# Patient Record
Sex: Male | Born: 1980
Health system: Southern US, Community
[De-identification: ages and names within clinical notes are randomized; demographics above are authoritative.]

## PROBLEM LIST (undated history)

## (undated) DIAGNOSIS — I10 Essential (primary) hypertension: Secondary | ICD-10-CM

## (undated) DIAGNOSIS — E7800 Pure hypercholesterolemia, unspecified: Secondary | ICD-10-CM

## (undated) HISTORY — PX: VASECTOMY: SHX75

## (undated) HISTORY — DX: Essential (primary) hypertension: I10

---

## 2005-09-22 DIAGNOSIS — M549 Dorsalgia, unspecified: Secondary | ICD-10-CM

## 2005-09-22 HISTORY — DX: Dorsalgia, unspecified: M54.9

## 2006-09-22 HISTORY — PX: CYST EXCISION: SHX5701

## 2012-08-20 ENCOUNTER — Emergency Department: Payer: Self-pay | Admitting: Emergency Medicine

## 2013-08-31 ENCOUNTER — Emergency Department: Payer: Self-pay

## 2013-09-03 LAB — BETA STREP CULTURE(ARMC)

## 2014-04-12 ENCOUNTER — Encounter: Payer: Self-pay | Admitting: General Surgery

## 2014-05-03 ENCOUNTER — Ambulatory Visit (INDEPENDENT_AMBULATORY_CARE_PROVIDER_SITE_OTHER): Payer: BC Managed Care – PPO | Admitting: General Surgery

## 2014-05-03 ENCOUNTER — Encounter: Payer: Self-pay | Admitting: General Surgery

## 2014-05-03 VITALS — BP 118/80 | HR 70 | Resp 12 | Ht 70.0 in | Wt 207.0 lb

## 2014-05-03 DIAGNOSIS — Z3009 Encounter for other general counseling and advice on contraception: Secondary | ICD-10-CM

## 2014-05-03 NOTE — Progress Notes (Signed)
Patient ID: Michael Stein, male   DOB: 07/02/1981, 33 y.o.   MRN: [id number]  Chief Complaint  Patient presents with  . Other    Vasectomy consult    HPI Michael Stein is a 33 y.o. male here today for a vasectomy consult with his wife. They have 4 children, the youngest boy is 682 weeks old the oldest daughter is 2614.  HPI  No past medical history on file.  Past Surgical History  Procedure Laterality Date  . Cyst excision  2008    ingrown hair    No family history on file.  Social History History  Substance Use Topics  . Smoking status: Never Smoker   . Smokeless tobacco: Never Used  . Alcohol Use: Yes    No Known Allergies  No current outpatient prescriptions on file.   No current facility-administered medications for this visit.    Review of Systems Review of Systems  Constitutional: Negative.   Respiratory: Negative.   Cardiovascular: Negative.     Blood pressure 118/80, pulse 70, resp. rate 12, height 5\' 10"  (1.778 m), weight 207 lb (93.895 kg).  Physical Exam Physical Exam  Constitutional: He is oriented to person, place, and time. He appears well-developed and well-nourished.  Eyes: Conjunctivae are normal.  Neck: Neck supple.  Cardiovascular: Normal rate, regular rhythm and normal heart sounds.   Pulmonary/Chest: Effort normal and breath sounds normal.  Genitourinary:  Vas present bilaterally. Normal testis.  Lymphadenopathy:    He has no cervical adenopathy.  Neurological: He is alert and oriented to person, place, and time.  Skin: Skin is warm and dry.     Assessment    Patient and wife desire elective sterilization.     Plan    Pros and cons of sterilization reviewed.  Patient reluctant to have procedure under local anesthesia. No medical contraindication for sedation.  [facility]    Ref. Dr Kipp BroodStrickland    Michael Stein, Merrily PewJeffrey W 05/04/2014, 3:13 PM

## 2014-05-03 NOTE — Patient Instructions (Addendum)
Vasectomy A vasectomy is tying (with or without cutting) the tube that collects the sperm from the testicle (vas deferens). The vasectomy blocks the sperm from going through the vas deferens and penis so that during sexual intercourse, the sperm does not go into the vagina. Vasectomy is safe, with very rare complications. It does not affect your sexual desire or performance. A vasectomy does not prevent sexually transmitted diseases. Because vasectomy is considered permanent, you should not have it done until you are sure you do not want any more children. You and your partner should be in full agreement to have the procedure. Your decision to have a vasectomy should not be made during a stressful situation. This includes loss of a pregnancy, illness, death of a spouse, or divorce. There are other means of contraception that can be used until you are completely sure you want this procedure done.  LET [facility] CARE PROVIDER KNOW ABOUT:   Any allergies you have.  All medicines you are taking, including vitamins, herbs, eye drops, creams, and over-the-counter medicines.  Previous problems you or members of your family have had with the use of anesthetics.  Any blood disorders you have.  Previous surgeries you have had.  Medical conditions you have. RISKS AND COMPLICATIONS Generally, vasectomy is a safe procedure. However, as with any procedure, complications can occur. Possible complications include:  Failure of the procedure to cause infertility. This means you would still be able to get a male pregnant. Even after sterilization has been achieved, there is a 1 in 10,000 chance that the two cut ends may reconnect (recanalization).  Infection. A germ starts growing in the wound. This can usually be treated with antibiotic medicine(s).  An allergic reaction to the anesthetic or other medicine given.  Bleeding. Blood may seep under the skin so that the scrotum and penis appear to be bruised.  Sometimes the scrotum can swell and get the size of a grapefruit. This usually disappears without treatment within a week or two. BEFORE THE PROCEDURE  Do not take aspirin or aspirin-containing products for 7 days prior to your procedure.  Do not take nonsteroidal anti-inflammatory products for 7 days prior to your procedure.  You may be instructed to wash with soap before coming in for your procedure. PROCEDURE  The scrotum is cleaned with bacteria-killing soap, and the health care provider finds the vas deferens.  Each side of the scrotum is numbed.  A very small cut (incision) is made, and the vas deferens are pulled out of the scrotum. The vas deferens are then tied off, cut, or may be burned (cauterized) at the ends.  Sometimes the vas deferens are pulled out from the scrotum through a puncture wound. This is done with a special instrument without an incision.  The vas deferens are then put back into the scrotum, and the incision or puncture wound is closed. Absorbable suture material that will dissolve and not need to be removed is commonly used.  After surgery, sperm may still be left in the vas deferens for 1-3 months. Because of this, other means of contraception should be used until your health care provider examines you and finds there are no sperm in your seminal fluid. AFTER THE PROCEDURE After the procedure, you will be taken to the recovery area. Your progress will be watched and checked. Once you are awake, stable, and taking fluids well, you will be allowed to go home as long as there are no problems.  Document Released: 11/29/2002  Document Revised: 09/13/2013 Document Reviewed: 03/28/2013 [facility] Patient Information 2015 [place]. This information is not intended to replace advice given to you by your health care provider. Make sure you discuss any questions you have with your health care provider.  [facility].

## 2014-05-04 ENCOUNTER — Other Ambulatory Visit: Payer: Self-pay | Admitting: General Surgery

## 2014-05-04 DIAGNOSIS — Z3009 Encounter for other general counseling and advice on contraception: Secondary | ICD-10-CM | POA: Insufficient documentation

## 2014-05-10 ENCOUNTER — Telehealth: Payer: Self-pay | Admitting: *Deleted

## 2014-05-10 NOTE — Telephone Encounter (Signed)
Patient called the office to reschedule surgery from 05-12-14 to 05-26-14.   Leah in the O.R. has been notified of date change.

## 2014-05-26 ENCOUNTER — Ambulatory Visit: Payer: Self-pay | Admitting: General Surgery

## 2014-05-26 DIAGNOSIS — Z302 Encounter for sterilization: Secondary | ICD-10-CM

## 2014-05-30 ENCOUNTER — Encounter: Payer: Self-pay | Admitting: General Surgery

## 2014-05-31 ENCOUNTER — Encounter: Payer: Self-pay | Admitting: General Surgery

## 2014-05-31 ENCOUNTER — Encounter: Payer: BC Managed Care – PPO | Admitting: General Surgery

## 2014-05-31 NOTE — Progress Notes (Signed)
This encounter was created in error - please disregard.

## 2014-05-31 NOTE — Progress Notes (Deleted)
Patient ID: Michael Stein, male   DOB: Apr 06, 1981, 33 y.o.   MRN: [id number]  Chief Complaint  Patient presents with  . Routine Post Op    Vasectomy     HPI Michael Stein is a 33 y.o. male  HPI  No past medical history on file.  Past Surgical History  Procedure Laterality Date  . Cyst excision  2008    ingrown hair  . Vasectomy  05/26/14.    No family history on file.  Social History History  Substance Use Topics  . Smoking status: Never Smoker   . Smokeless tobacco: Never Used  . Alcohol Use: Yes    No Known Allergies  No current outpatient prescriptions on file.   No current facility-administered medications for this visit.    Review of Systems Review of Systems  Blood pressure 130/90, pulse 64, resp. rate 12, height  (1.778 m), weight 205 lb (92.987 kg).  Physical Exam Physical Exam  Data Reviewed ***  Assessment    ***    Plan    ***       Ples Specter 05/31/2014, 3:27 PM

## 2014-06-01 ENCOUNTER — Encounter: Payer: Self-pay | Admitting: General Surgery

## 2014-06-01 LAB — PATHOLOGY REPORT

## 2014-07-27 ENCOUNTER — Telehealth: Payer: Self-pay | Admitting: *Deleted

## 2014-07-27 NOTE — Telephone Encounter (Signed)
Patient states he forgot and that he would see about bringing a specimen in to send to lab.

## 2014-07-27 NOTE — Telephone Encounter (Signed)
-----   Message from Michael MayotteJeffrey W Byrnett, MD sent at 07/26/2014  5:08 PM EST ----- Patient never had post vasectomy semem samples. Needs to be done to confirm sterility.

## 2014-09-05 ENCOUNTER — Encounter: Payer: Self-pay | Admitting: *Deleted

## 2015-01-13 NOTE — Op Note (Signed)
PATIENT NAME:  Michael NicolasDAVILA, [name] MR#:  [id number] DATE OF BIRTH:  Oct 20, 1980  DATE OF PROCEDURE:  05/26/2014  PREOPERATIVE DIAGNOSIS: Desires permanent sterilization.   POSTOPERATIVE DIAGNOSIS:  Desires permanent sterilization.  OPERATIVE PROCEDURE: Bilateral vasectomy.   SURGEON: Donnalee CurryJeffrey Daira Hine, MD   ANESTHESIA: Monitored anesthesia care, Marcaine 0.25% with Xylocaine 0.5% plain 19 mL local infiltration.   ESTIMATED BLOOD LOSS: Minimal.   CLINICAL NOTE: This 34 year old male and his wife desire permanent sterilization.  He declined office procedure under local anesthesia alone.   OPERATIVE NOTE:  With the patient under adequate sedation, the penis and scrotum were prepped with Betadine scrub and solution and draped.  The above-mentioned local anesthetic was instilled.  A transverse skin line incision was made in the median raphe and the left vas delivered.  The vas was cleared, a 1 cm segment excised.  The lumen obliterated with electrocautery, each end tied with 3-0 Vicryl and then the lumen separated by the external spermatic fascia.   The right vas was treated in a similar fashion.  The subcutaneous tissue was approximated with 3-0 Vicryl subcuticular suture and a dry dressing with a scrotal support was placed.    The patient tolerated the procedure well and was taken to the recovery room in stable condition.      ____________________________ Earline MayotteJeffrey W. Oswin Johal, MD jwb:DT D: 05/26/2014 17:07:22 ET T: 05/26/2014 17:39:23 ET JOB#: [id number]  cc: Earline MayotteJeffrey W. Roscoe Witts, MD, <Dictator> Channah Godeaux Brion AlimentW Sheyann Sulton MD ELECTRONICALLY SIGNED 05/26/2014 19:02

## 2015-02-25 ENCOUNTER — Emergency Department
Admission: EM | Admit: 2015-02-25 | Discharge: 2015-02-26 | Disposition: A | Discharge status: Home / Self Care | Payer: BLUE CROSS/BLUE SHIELD | Attending: Emergency Medicine | Admitting: Emergency Medicine

## 2015-02-25 ENCOUNTER — Encounter: Payer: Self-pay | Admitting: Emergency Medicine

## 2015-02-25 ENCOUNTER — Emergency Department: Payer: BLUE CROSS/BLUE SHIELD

## 2015-02-25 DIAGNOSIS — Z79899 Other long term (current) drug therapy: Secondary | ICD-10-CM | POA: Diagnosis not present

## 2015-02-25 DIAGNOSIS — R161 Splenomegaly, not elsewhere classified: Secondary | ICD-10-CM | POA: Insufficient documentation

## 2015-02-25 DIAGNOSIS — R109 Unspecified abdominal pain: Secondary | ICD-10-CM | POA: Diagnosis present

## 2015-02-25 DIAGNOSIS — R1032 Left lower quadrant pain: Secondary | ICD-10-CM

## 2015-02-25 HISTORY — DX: Pure hypercholesterolemia, unspecified: E78.00

## 2015-02-25 LAB — CBC WITH DIFFERENTIAL/PLATELET
Basophils Absolute: 0 10*3/uL (ref 0–0.1)
Basophils Relative: 1 %
Eosinophils Absolute: 0.2 10*3/uL (ref 0–0.7)
Eosinophils Relative: 4 %
HCT: 40.9 % (ref 40.0–52.0)
Hemoglobin: 13.9 g/dL (ref 13.0–18.0)
LYMPHS ABS: 1.7 10*3/uL (ref 1.0–3.6)
Lymphocytes Relative: 38 %
MCH: 28.7 pg (ref 26.0–34.0)
MCHC: 34 g/dL (ref 32.0–36.0)
MCV: 84.5 fL (ref 80.0–100.0)
MONO ABS: 0.4 10*3/uL (ref 0.2–1.0)
Monocytes Relative: 8 %
Neutro Abs: 2.3 10*3/uL (ref 1.4–6.5)
Neutrophils Relative %: 49 %
PLATELETS: 209 10*3/uL (ref 150–440)
RBC: 4.85 MIL/uL (ref 4.40–5.90)
RDW: 13.1 % (ref 11.5–14.5)
WBC: 4.6 10*3/uL (ref 3.8–10.6)

## 2015-02-25 LAB — URINALYSIS COMPLETE WITH MICROSCOPIC (ARMC ONLY)
BILIRUBIN URINE: NEGATIVE
Bacteria, UA: NONE SEEN
GLUCOSE, UA: NEGATIVE mg/dL
HGB URINE DIPSTICK: NEGATIVE
Ketones, ur: NEGATIVE mg/dL
LEUKOCYTES UA: NEGATIVE
NITRITE: NEGATIVE
PH: 5 (ref 5.0–8.0)
Protein, ur: NEGATIVE mg/dL
SPECIFIC GRAVITY, URINE: 1.031 — AB (ref 1.005–1.030)
SQUAMOUS EPITHELIAL / LPF: NONE SEEN

## 2015-02-25 MED ORDER — ONDANSETRON 8 MG PO TBDP
8.0000 mg | ORAL_TABLET | Freq: Once | ORAL | Status: AC
  Administered 2015-02-25: 8 mg via ORAL

## 2015-02-25 MED ORDER — OXYCODONE-ACETAMINOPHEN 5-325 MG PO TABS
1.0000 | ORAL_TABLET | Freq: Once | ORAL | Status: AC
  Administered 2015-02-25: 1 via ORAL

## 2015-02-25 MED ORDER — ONDANSETRON 8 MG PO TBDP
ORAL_TABLET | ORAL | Status: AC
  Administered 2015-02-25: 8 mg via ORAL
  Filled 2015-02-25: qty 1

## 2015-02-25 MED ORDER — OXYCODONE-ACETAMINOPHEN 5-325 MG PO TABS
ORAL_TABLET | ORAL | Status: AC
  Administered 2015-02-25: 1 via ORAL
  Filled 2015-02-25: qty 1

## 2015-02-25 NOTE — ED Notes (Signed)
Pt states left lower quad pain since Friday, worse over the last few days, denies n/v/d. States pain increases with palpation. Pt denies urinary s/s.

## 2015-02-26 LAB — COMPREHENSIVE METABOLIC PANEL
ALBUMIN: 4.8 g/dL (ref 3.5–5.0)
ALT: 43 U/L (ref 17–63)
AST: 31 U/L (ref 15–41)
Alkaline Phosphatase: 77 U/L (ref 38–126)
Anion gap: 7 (ref 5–15)
BUN: 14 mg/dL (ref 6–20)
CHLORIDE: 105 mmol/L (ref 101–111)
CO2: 25 mmol/L (ref 22–32)
CREATININE: 0.81 mg/dL (ref 0.61–1.24)
Calcium: 9.1 mg/dL (ref 8.9–10.3)
GFR calc Af Amer: >60 mL/min (ref >60)
GFR calc non Af Amer: >60 mL/min (ref >60)
Glucose, Bld: 93 mg/dL (ref 65–99)
Potassium: 3.5 mmol/L (ref 3.5–5.1)
Sodium: 137 mmol/L (ref 135–145)
Total Bilirubin: 0.6 mg/dL (ref 0.3–1.2)
Total Protein: 8 g/dL (ref 6.5–8.1)

## 2015-02-26 LAB — LIPASE, BLOOD: LIPASE: 48 U/L (ref 22–51)

## 2015-02-26 MED ORDER — ONDANSETRON HCL 4 MG PO TABS: 4.0000 mg | ORAL_TABLET | Freq: Four times a day (QID) | ORAL | Status: DC | PRN

## 2015-02-26 MED ORDER — DICYCLOMINE HCL 20 MG PO TABS: 20.0000 mg | ORAL_TABLET | Freq: Three times a day (TID) | ORAL | Status: DC | PRN

## 2015-02-26 MED ORDER — NAPROXEN 250 MG PO TABS: 250.0000 mg | ORAL_TABLET | Freq: Two times a day (BID) | ORAL | Status: DC

## 2015-02-26 NOTE — ED Provider Notes (Signed)
[facility] Emergency Department Provider Note  ____________________________________________  Time seen: 11:05 PM  I have reviewed the triage vital signs and the nursing notes.   HISTORY  Chief Complaint Abdominal Pain    HPI Michael Stein is a 34 y.o. male who reports abdominal pain for 2 days. It initially started periumbilically and then migrated to the left lower quadrant. No nausea vomiting or diarrhea, he is eating and drinking normally, no chest pain shortness of breath cough fever chills runny nose or other symptoms. No dysuria frequency urgency or hematuria.     Past Medical History  Diagnosis Date  . High cholesterol     Patient Active Problem List   Diagnosis Date Noted  . Vasectomy evaluation 05/04/2014    Past Surgical History  Procedure Laterality Date  . Cyst excision  2008    ingrown hair  . Vasectomy  05/26/14.    Current Outpatient Rx  Name  Route  Sig  Dispense  Refill  . atorvastatin (LIPITOR) 10 MG tablet   Oral   Take 10 mg by mouth daily.         Marland Kitchen dicyclomine (BENTYL) 20 MG tablet   Oral   Take 1 tablet (20 mg total) by mouth 3 (three) times daily as needed for spasms.   30 tablet   0   . naproxen (NAPROSYN) 250 MG tablet   Oral   Take 1 tablet (250 mg total) by mouth 2 (two) times daily with a meal.   40 tablet   0   . ondansetron (ZOFRAN) 4 MG tablet   Oral   Take 1 tablet (4 mg total) by mouth every 6 (six) hours as needed for nausea or vomiting.   20 tablet   1     Allergies Review of patient's allergies indicates no known allergies.  History reviewed. No pertinent family history.  Social History History  Substance Use Topics  . Smoking status: Never Smoker   . Smokeless tobacco: Never Used  . Alcohol Use: Yes    Review of Systems  Constitutional: No fever or chills. No weight changes Eyes:No blurry vision or double vision.  ENT: No sore throat. Cardiovascular: No chest  pain. Respiratory: No dyspnea or cough. Gastrointestinal: Abdominal pain as above, no, vomiting and diarrhea.  No BRBPR or melena. Genitourinary: Negative for dysuria, urinary retention, bloody urine, or difficulty urinating. Musculoskeletal: Negative for back pain. No joint swelling or pain. Skin: Negative for rash. Neurological: Negative for headaches, focal weakness or numbness. Psychiatric:No anxiety or depression.   Endocrine:No hot/cold intolerance, changes in energy, or sleep difficulty.  10-point ROS otherwise negative.  ____________________________________________   PHYSICAL EXAM:  VITAL SIGNS: ED Triage Vitals  Enc Vitals Group     BP 02/25/15 2316 160/105 mmHg     Pulse Rate 02/25/15 2316 61     Resp 02/25/15 2316 18     Temp 02/25/15 2316 98.1 F (36.7 C)     Temp Source 02/25/15 2316 Oral     SpO2 02/25/15 2316 97 %     Weight 02/25/15 2305 205 lb (92.987 kg)     Height 02/25/15 2305  (1.753 m)     Head Cir --      Peak Flow --      Pain Score 02/25/15 2305 8     Pain Loc --      Pain Edu? --      Excl. in GC? --      Constitutional:  Alert and oriented. Well appearing and in no distress. Eyes: No scleral icterus. No conjunctival pallor. PERRL. EOMI ENT   Head: Normocephalic and atraumatic.   Nose: No congestion/rhinnorhea. No septal hematoma   Mouth/Throat: MMM, no pharyngeal erythema. No peritonsillar mass. No uvula shift.   Neck: No stridor. No SubQ emphysema. No meningismus. Hematological/Lymphatic/Immunilogical: No cervical lymphadenopathy. Cardiovascular: RRR. Normal and symmetric distal pulses are present in all extremities. No murmurs, rubs, or gallops. Respiratory: Normal respiratory effort without tachypnea nor retractions. Breath sounds are clear and equal bilaterally. No wheezes/rales/rhonchi. Gastrointestinal: Right lower quadrant and left lower quadrant abdominal pain.. No distention. There is no CVA tenderness.  No rebound,  rigidity, or guarding. Genitourinary: deferred Musculoskeletal: Nontender with normal range of motion in all extremities. No joint effusions.  No lower extremity tenderness.  No edema. Neurologic:   Normal speech and language.  CN 2-10 normal. Motor grossly intact. No pronator drift.  Normal gait. No gross focal neurologic deficits are appreciated.  Skin:  Skin is warm, dry and intact. No rash noted.  No petechiae, purpura, or bullae. Psychiatric: Mood and affect are normal. Speech and behavior are normal. Patient exhibits appropriate insight and judgment.  ____________________________________________    LABS (pertinent positives/negatives) (all labs ordered are listed, but only abnormal results are displayed) Labs Reviewed  URINALYSIS COMPLETEWITH MICROSCOPIC ([facility] ONLY) - Abnormal; Notable for the following:    Color, Urine YELLOW (*)    APPearance CLEAR (*)    Specific Gravity, Urine 1.031 (*)    All other components within normal limits  CBC WITH DIFFERENTIAL/PLATELET  COMPREHENSIVE METABOLIC PANEL  LIPASE, BLOOD   ____________________________________________   EKG    ____________________________________________    RADIOLOGY  CT abdomen and pelvis unremarkable except for splenomegaly  ____________________________________________   PROCEDURES  ____________________________________________   INITIAL IMPRESSION / ASSESSMENT AND PLAN / ED COURSE  Pertinent labs & imaging results that were available during my care of the patient were reviewed by me and considered in my medical decision making (see chart for details).  Workup unremarkable except for splenomegaly. The patient has no other specific symptoms but due to this finding along with the somewhat generalized abdominal pain and tenderness, most likely is developing a viral illness. I'll prescribe him Bentyl Zofran and naproxen for symptom relief and have him follow-up with primary care if he is not improved  within a few days.  ____________________________________________   FINAL CLINICAL IMPRESSION(S) / ED DIAGNOSES  Final diagnoses:  Left lower quadrant pain  Splenomegaly      Sharman CheekPhillip Efstathios Sawin, MD 02/26/15 [phone]

## 2015-02-26 NOTE — ED Notes (Signed)
Patient with no complaints at this time. Respirations even and unlabored. Skin warm/dry. Discharge instructions reviewed with patient at this time. Patient given opportunity to voice concerns/ask questions. Patient discharged at this time and left Emergency Department with steady gait.   

## 2015-02-26 NOTE — Discharge Instructions (Signed)
Abdominal Pain Many things can cause abdominal pain. Usually, abdominal pain is not caused by a disease and will improve without treatment. It can often be observed and treated at home. Your health care provider will do a physical exam and possibly order blood tests and X-rays to help determine the seriousness of your pain. However, in many cases, more time must pass before a clear cause of the pain can be found. Before that point, your health care provider may not know if you need more testing or further treatment. HOME CARE INSTRUCTIONS  Monitor your abdominal pain for any changes. The following actions may help to alleviate any discomfort you are experiencing:  Only take over-the-counter or prescription medicines as directed by your health care provider.  Do not take laxatives unless directed to do so by your health care provider.  Try a clear liquid diet (broth, tea, or water) as directed by your health care provider. Slowly move to a bland diet as tolerated. SEEK MEDICAL CARE IF:  You have unexplained abdominal pain.  You have abdominal pain associated with nausea or diarrhea.  You have pain when you urinate or have a bowel movement.  You experience abdominal pain that wakes you in the night.  You have abdominal pain that is worsened or improved by eating food.  You have abdominal pain that is worsened with eating fatty foods.  You have a fever. SEEK IMMEDIATE MEDICAL CARE IF:   Your pain does not go away within 2 hours.  You keep throwing up (vomiting).  Your pain is felt only in portions of the abdomen, such as the right side or the left lower portion of the abdomen.  You pass bloody or black tarry stools. MAKE SURE YOU:  Understand these instructions.   Will watch your condition.   Will get help right away if you are not doing well or get worse.  Document Released: 06/18/2005 Document Revised: 09/13/2013 Document Reviewed: 05/18/2013 [facility] Patient Information  2015 [place], [place]. This information is not intended to replace advice given to you by your health care provider. Make sure you discuss any questions you have with your health care provider.  Enlarged Spleen The spleen is an organ located in the upper abdomen under your left ribs. It is a spongelike organ, about the size of an orange, which acts as a filter. The spleen is part of the lymph system and filters the blood. It removes old blood cells and abnormal blood cells. It is also part of the immune response and helps fight infections. An enlarged spleen (splenomegaly) is usually noticed when it is almost twice its normal size. CAUSES  There are many possible causes of an enlarged spleen. These causes include:  Infections (viral, bacterial, or parasitic).  Liver cirrhosis and other liver diseases.  Hemolytic anemia (types of anemia that lower your red blood cell count) and other blood diseases.  Hypersplenism (reduction in many types of blood cells by an enlarged spleen).  Blood cancers (leukemia, Hodgkin's disease).  Metabolic disorders (Gaucher's disease, Niemann-Pick disease).  Tumors and cysts.  Pressure or blood clots in the veins of the spleen.  Connective tissue disorders (lupus, rheumatoid arthritis with Felty's syndrome). SYMPTOMS  An enlarged spleen may not always cause symptoms. If symptoms do occur, they may include:  Pain in the upper left abdomen (pain may spread to the left shoulder or get worse when you take a breath).  Feeling full without eating or eating only a small amount.  Feeling tired.  Chronic infections.  Bleeding easily. DIAGNOSIS  Tests may include:  Physical examination of the left upper abdomen.  Blood tests to check red and white blood cells and other proteins and enzymes.  Imaging tests, such as abdominal ultrasonography, computerized X-ray scan (computed tomography, CT), and computerized magnetic scan (magnetic resonance imaging,  MRI).  Taking a tissue sample (biopsy) of the liver to examine it.  Examining a bone marrow biopsy sample. TREATMENT  Treatment varies depending on the cause of the enlarged spleen. Treatment aims to manage the conditions that cause swelling of the spleen and reduce the size of the spleen. Treatment may include:  Medications to eliminate infection or treat disease.  Radiation therapy.  Blood transfusions.  Vaccinations. If these treatments are not successful, or the cause cannot be determined, surgery to remove the spleen (splenectomy) may be recommended. HOME CARE INSTRUCTIONS   Take all medications as directed.  Take all antibiotics, even if you start to feel better. Discuss with your caregiver the use of a probiotic supplement to prevent stomach upset.  To avoid injury or a ruptured spleen:  Limit activities as directed.  Avoid contact sports.  Wear your seat belt in the car.  See your caregiver for vaccinations, follow up examinations and testing as directed.  Follow all of your caregiver's instructions on managing the conditions that cause your enlarged spleen. PREVENTION  It is not always possible to prevent an enlarged spleen. Reduce your chances of developing an enlarged spleen:  Practice good hygiene to prevent infection.  Get recommended vaccines to prevent infection. SEEK MEDICAL CARE IF:   You develop a fever (more than 100.70F [38.1 C]) or other signs of infection (chills, feeling unwell).  You experience injury or impact to the spleen area.  Your symptoms do not go away as you and your doctor expected.  You experience increased pain when you take in a breath.  Your symptoms worsen, or you develop new symptoms. MAKE SURE YOU:   Understand these instructions.  Will watch your condition.  Will get help right away if you are not doing well or get worse. Follow up with your caregiver to find out the results of your tests. Not all test results may be  available during your visit. If your test results are not back during the visit, make an appointment with your caregiver to find out the results. Do not assume everything is normal if you have not heard from your caregiver or the medical facility. It is important for you to follow up on all of your test results.  Document Released: 02/26/2010 Document Revised: 01/23/2014 Document Reviewed: 02/26/2010 [facility] Patient Information 2015 [place], [place]. This information is not intended to replace advice given to you by your health care provider. Make sure you discuss any questions you have with your health care provider.

## 2015-05-27 ENCOUNTER — Emergency Department
Admission: EM | Admit: 2015-05-27 | Discharge: 2015-05-27 | Discharge status: Against Medical Advice | Payer: BLUE CROSS/BLUE SHIELD | Attending: Emergency Medicine | Admitting: Emergency Medicine

## 2015-05-27 ENCOUNTER — Encounter: Payer: Self-pay | Admitting: *Deleted

## 2015-05-27 DIAGNOSIS — H9209 Otalgia, unspecified ear: Secondary | ICD-10-CM | POA: Diagnosis not present

## 2015-05-27 DIAGNOSIS — J029 Acute pharyngitis, unspecified: Secondary | ICD-10-CM | POA: Insufficient documentation

## 2015-05-27 DIAGNOSIS — J3489 Other specified disorders of nose and nasal sinuses: Secondary | ICD-10-CM | POA: Insufficient documentation

## 2015-05-27 DIAGNOSIS — R509 Fever, unspecified: Secondary | ICD-10-CM | POA: Diagnosis present

## 2015-05-27 LAB — POCT RAPID STREP A: STREPTOCOCCUS, GROUP A SCREEN (DIRECT): NEGATIVE

## 2015-05-27 NOTE — ED Notes (Signed)
Pt presents w/ c/o sore throat, ear pain, cough, sinus congestion that has progressively worsened over past 10 days. Pt states he was running a fever greater than 102 yesterday but took tylenol and ibuprofen and reported to work. Pt states worsening symptoms tonight.

## 2015-05-27 NOTE — ED Notes (Signed)
Pt states that he can't wait for evaluation by MD.  Pt signed AMA form.  Pt ambulatory with even and steady gait from ED.

## 2016-07-19 ENCOUNTER — Ambulatory Visit
Admission: EM | Admit: 2016-07-19 | Discharge: 2016-07-19 | Disposition: A | Discharge status: Home / Self Care | Payer: Worker's Compensation | Attending: General Practice | Admitting: General Practice

## 2016-07-19 DIAGNOSIS — Z0283 Encounter for blood-alcohol and blood-drug test: Secondary | ICD-10-CM | POA: Insufficient documentation

## 2016-07-19 NOTE — ED Notes (Signed)
Pt WC paperwork completed by this tech, paperwork walked down to lab by this tech and proper paperwork needed by employer given to pt by this tech.

## 2016-09-24 IMAGING — CT CT RENAL STONE PROTOCOL
1 of 2 series · 15 of 32 positions shown, 19 images · non-contrast
Comparison: None.

ADDENDUM:
Addendum added to the impression:

Tiny 2 mm subpleural nodule in the left lower lobe. In a patient of
this age without known history of malignancy, this is most certainly
benign and no further follow-up is needed.
CLINICAL DATA: Left lower quadrant/flank pain for 3 days,
progressive worsening.
EXAM:
CT ABDOMEN AND PELVIS WITHOUT CONTRAST
TECHNIQUE: Multidetector CT imaging of the abdomen and pelvis was performed
following the standard protocol without IV contrast.

[Series 2: stone standard full · axial · 0.81mm/px · z∈[-544,-64]mm · 15 of 106 slices shown, 19 images]
[im 5/106  soft-tissue]
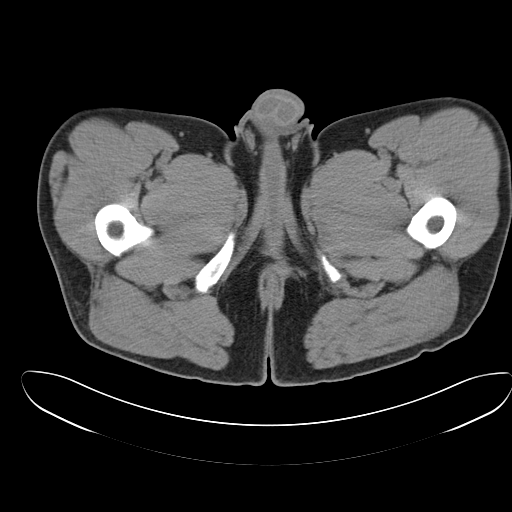
[im 5/106  bone]
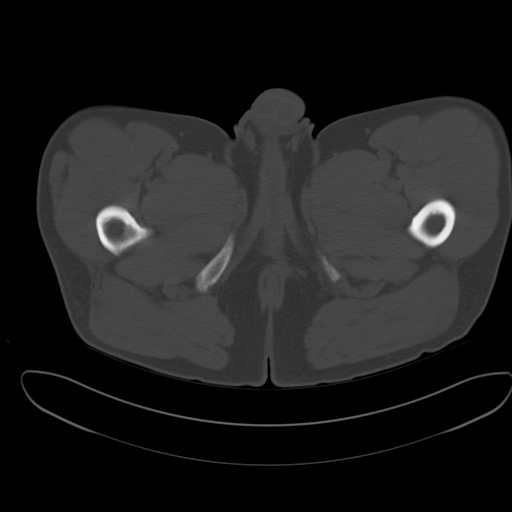
[im 13/106  soft-tissue]
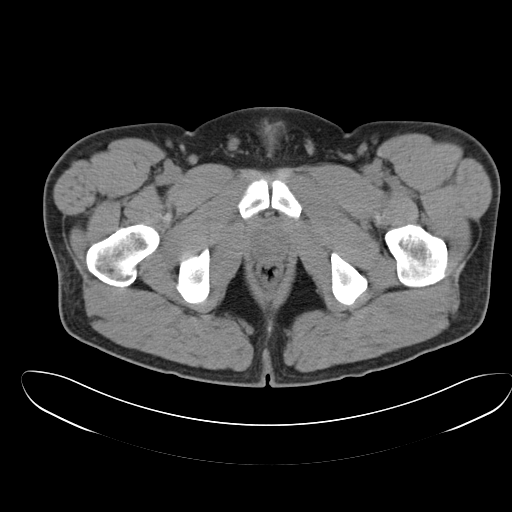
[im 22/106  soft-tissue]
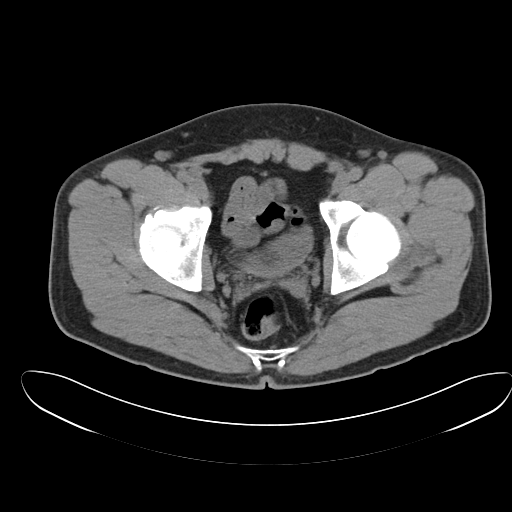
[im 30/106  soft-tissue]
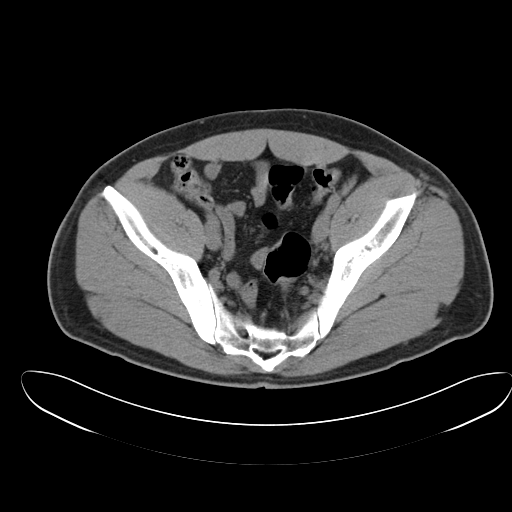
[im 38/106  soft-tissue]
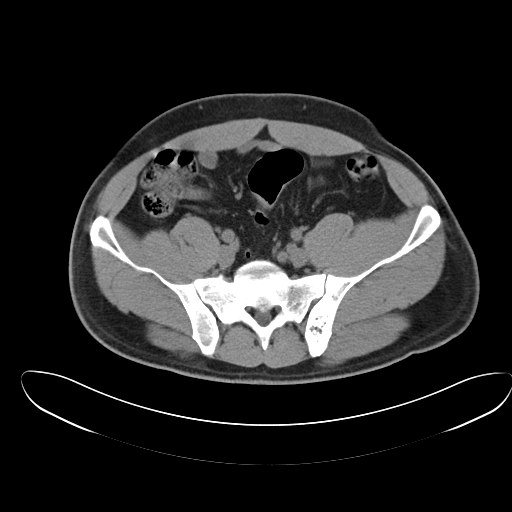
[im 47/106  soft-tissue]
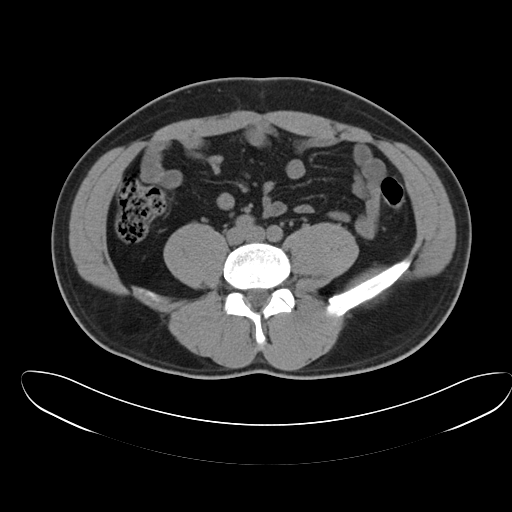
[im 55/106  soft-tissue]
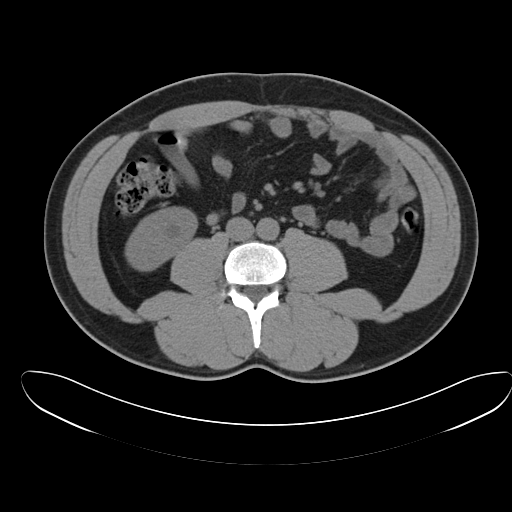
[im 59/106  soft-tissue]
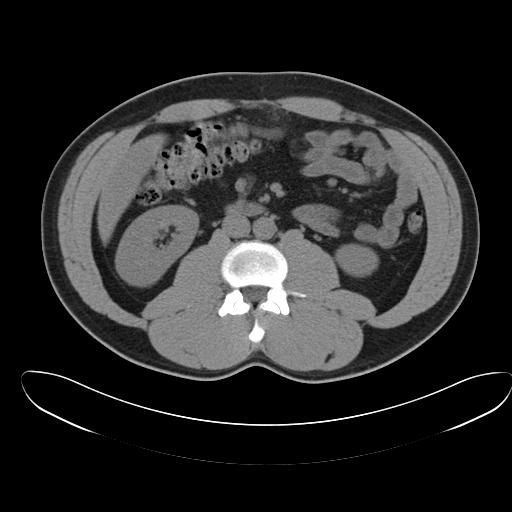
[im 68/106  soft-tissue]
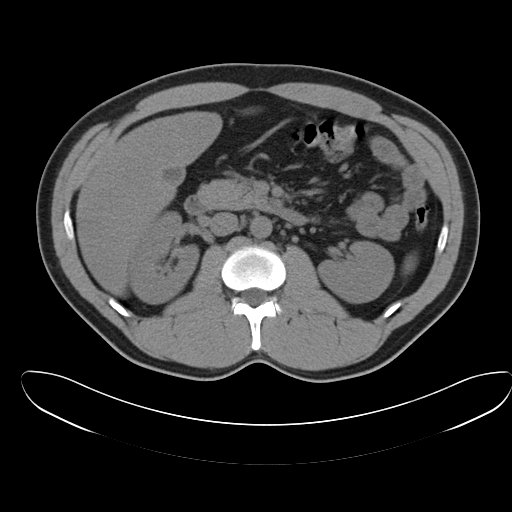
[im 68/106  bone]
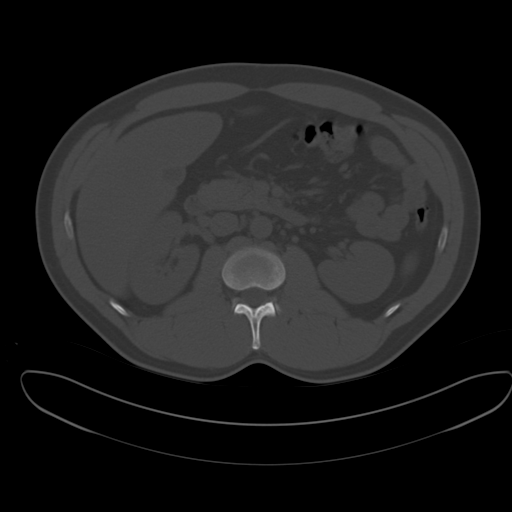
[im 76/106  soft-tissue]
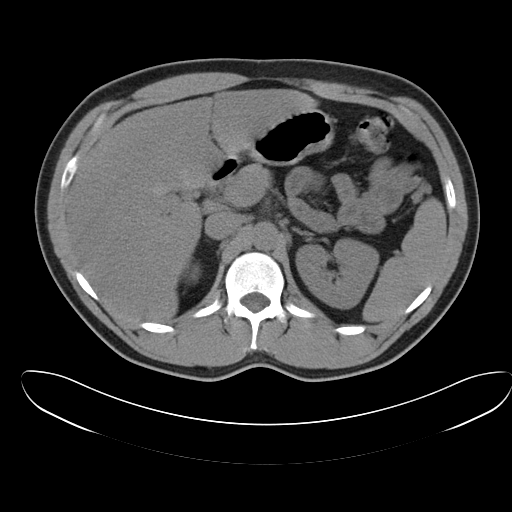
[im 85/106  soft-tissue]
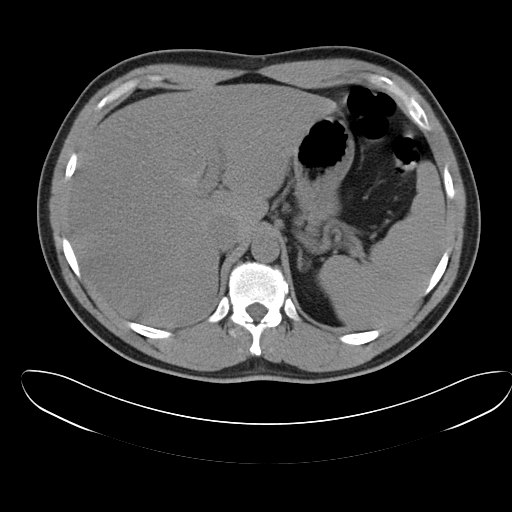
[im 89/106  lung]
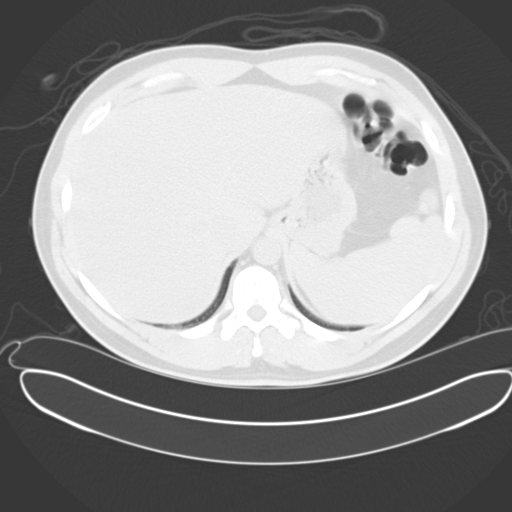
[im 93/106  soft-tissue]
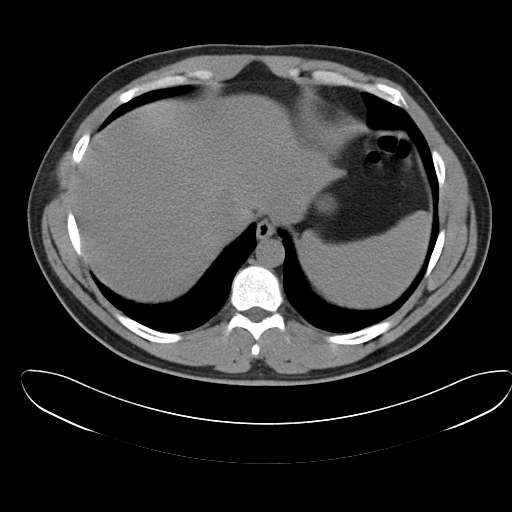
[im 93/106  lung]
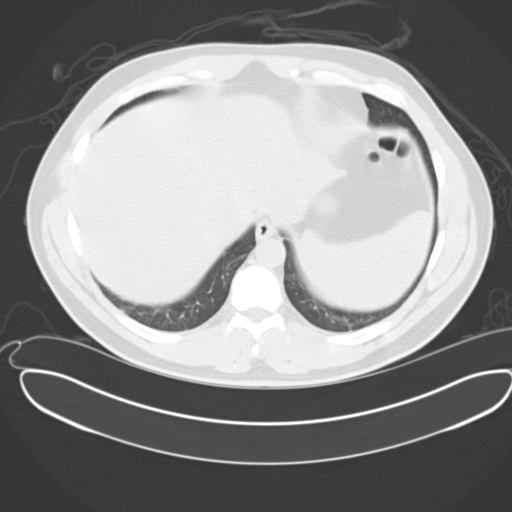
[im 97/106  lung]
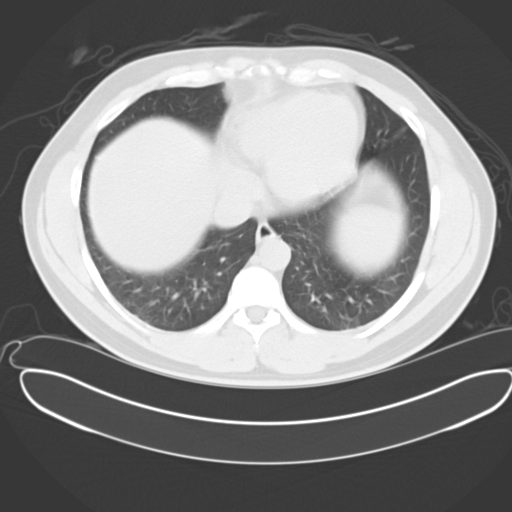
[im 101/106  soft-tissue]
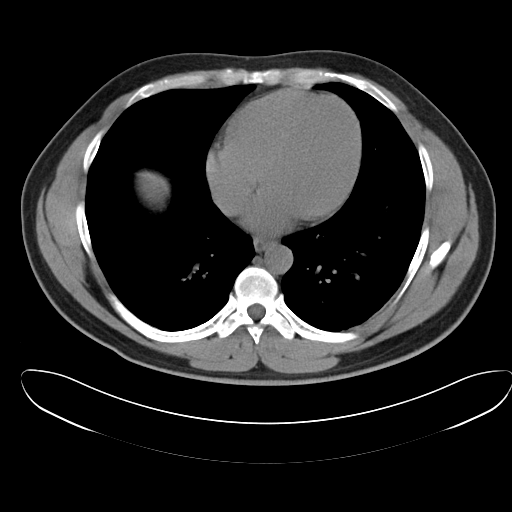
[im 101/106  lung]
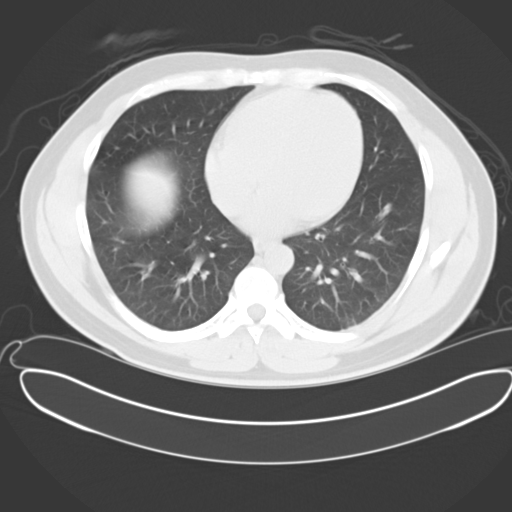

[15 of 32 positions shown; findings below may reference images not displayed]

FINDINGS: The included lung bases are clear. There is a tiny 2 mm subpleural
nodule in the left lower lobe.

The kidneys are symmetric in size without stones or hydronephrosis.
There is no perinephric stranding. Both ureters are decompressed
without stones along the course. There is a 1.6 cm simple cyst in
the lower right kidney.

Evaluation of the remaining solid and hollow viscera is limited
given lack of contrast. Mild hepatic steatosis. Mild splenomegaly,
spleen measures 14.7 cm in cranial caudal dimension. Gallbladder is
decompressed. The unenhanced pancreas and adrenal glands are normal.

Limited bowel assessment given lack of contrast, the stomach is
physiologically distended. There are no dilated or thickened bowel
loops. The appendix is normal. Small to moderate volume of colonic
stool without colonic wall thickening. The descending and sigmoid
colon are decompressed. No free air, free fluid, or intra-abdominal
fluid collection.

No retroperitoneal adenopathy. Abdominal aorta is normal in caliber.

Within the pelvis the bladder is minimally distended, no bladder
stone. Prostate gland is normal in size. No pelvic free fluid or
adenopathy.

There is transitional lumbosacral anatomy with 4 non-rib-bearing
lumbar vertebra. There are no acute or suspicious osseous
abnormalities.
IMPRESSION: 1.  No renal stones or obstructive uropathy.
2. Splenomegaly, spleen measures 14.7 cm.  Mild hepatic steatosis.

## 2017-12-22 ENCOUNTER — Other Ambulatory Visit: Payer: Self-pay | Admitting: Family Medicine

## 2017-12-22 DIAGNOSIS — R1011 Right upper quadrant pain: Secondary | ICD-10-CM

## 2017-12-28 ENCOUNTER — Ambulatory Visit: Payer: BLUE CROSS/BLUE SHIELD

## 2018-01-11 ENCOUNTER — Ambulatory Visit
Admission: RE | Admit: 2018-01-11 | Discharge: 2018-01-11 | Disposition: A | Discharge status: Home / Self Care | Payer: BLUE CROSS/BLUE SHIELD | Source: Ambulatory Visit | Attending: Family Medicine | Admitting: Family Medicine

## 2018-01-11 DIAGNOSIS — K824 Cholesterolosis of gallbladder: Secondary | ICD-10-CM | POA: Diagnosis not present

## 2018-01-11 DIAGNOSIS — R1011 Right upper quadrant pain: Secondary | ICD-10-CM | POA: Diagnosis present

## 2018-01-11 DIAGNOSIS — K839 Disease of biliary tract, unspecified: Secondary | ICD-10-CM | POA: Diagnosis not present

## 2018-01-11 DIAGNOSIS — K760 Fatty (change of) liver, not elsewhere classified: Secondary | ICD-10-CM | POA: Diagnosis not present

## 2019-08-11 IMAGING — US US ABDOMEN LIMITED
1 series · 14 of 25 positions shown · non-contrast
Comparison: 02/25/2015 CT.

CLINICAL DATA: 37-year-old male with right upper quadrant pain for
the past 6 months worse after eating. Initial encounter.

EXAM:
ULTRASOUND ABDOMEN LIMITED RIGHT UPPER QUADRANT

[Series 1: us abdomen limited · 14 of 56 slices shown]
[im 1/56]
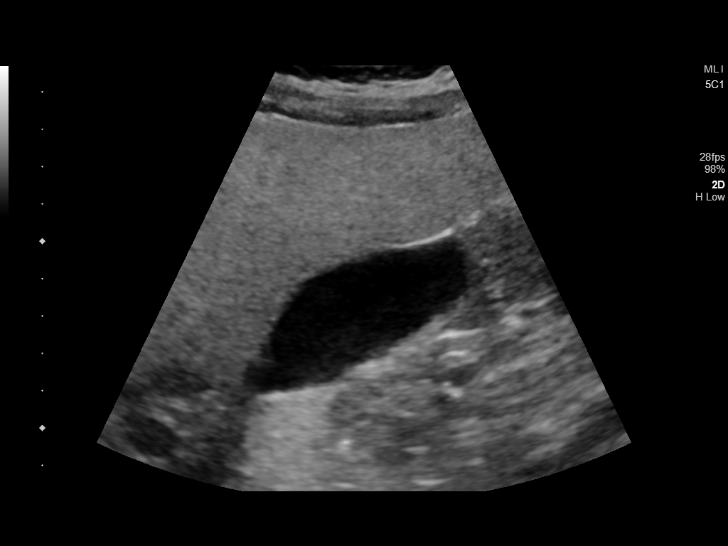
[im 5/56]
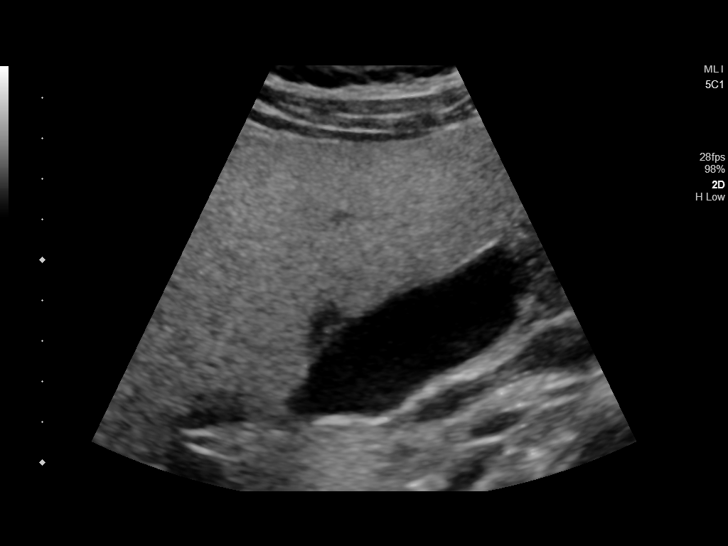
[im 10/56]
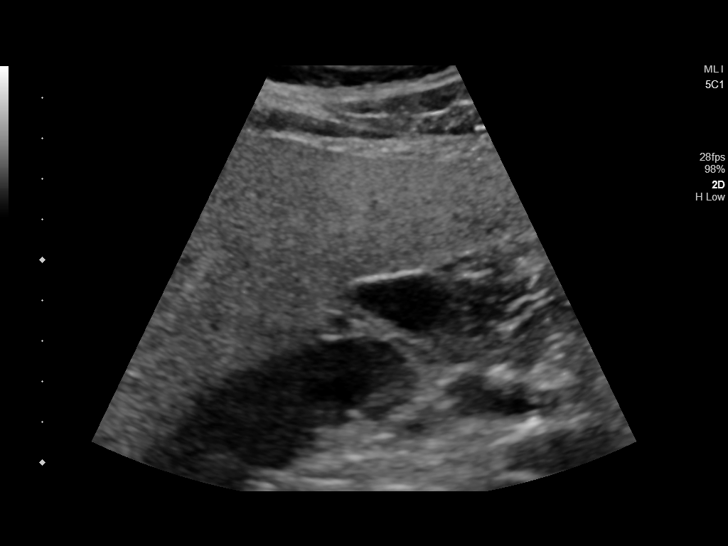
[im 14/56]
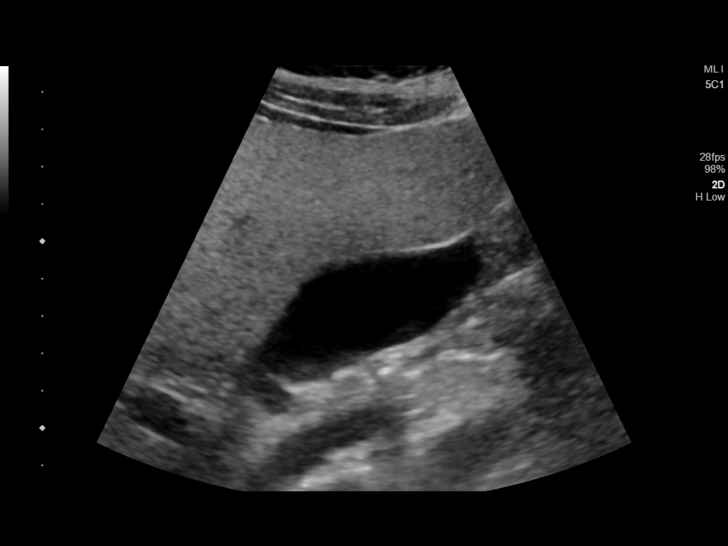
[im 19/56]
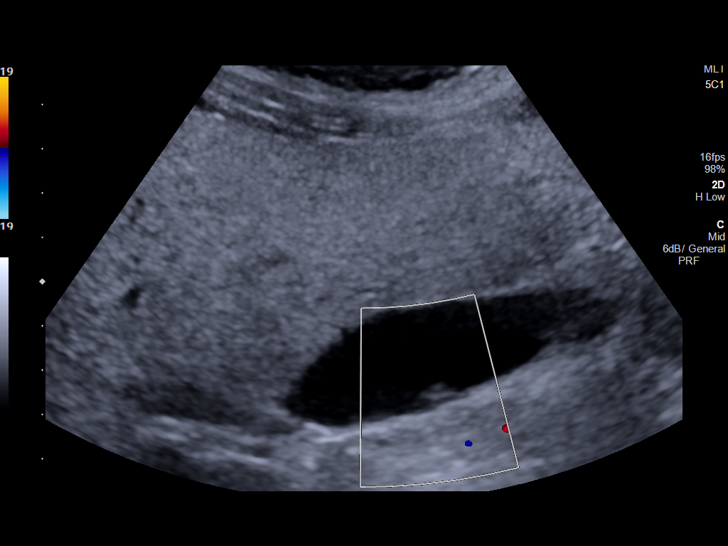
[im 21/56]
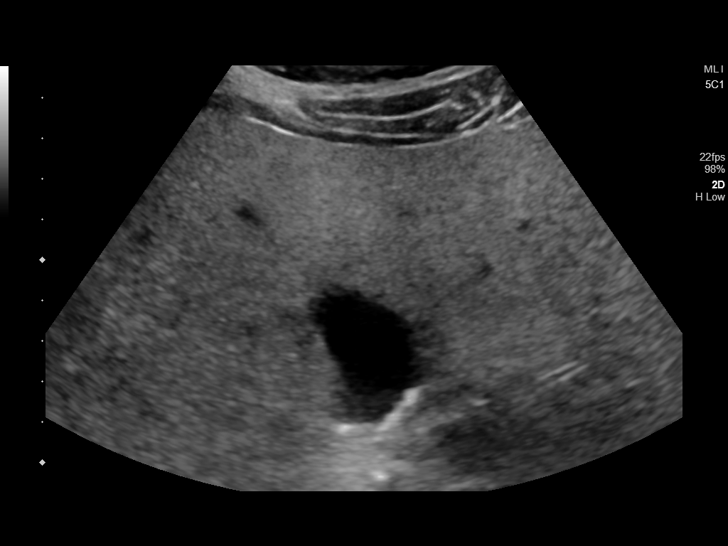
[im 26/56]
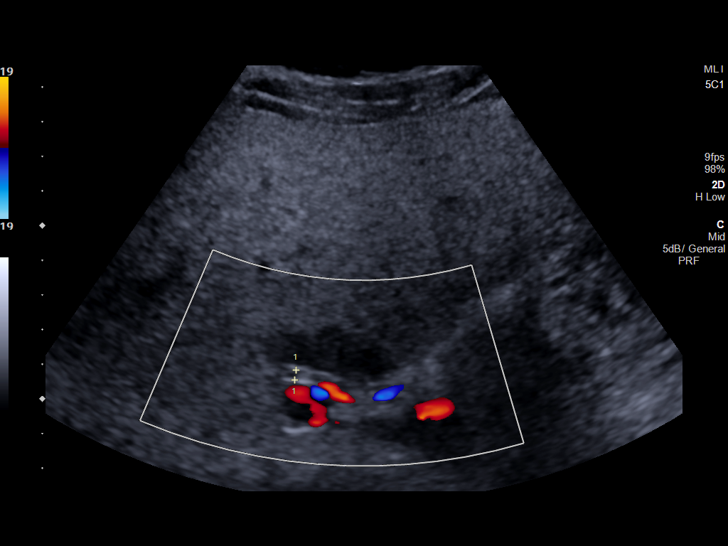
[im 30/56]
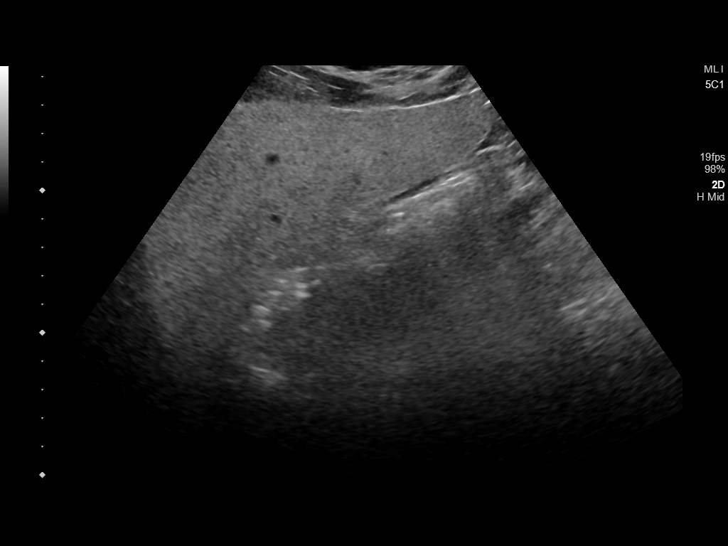
[im 35/56]
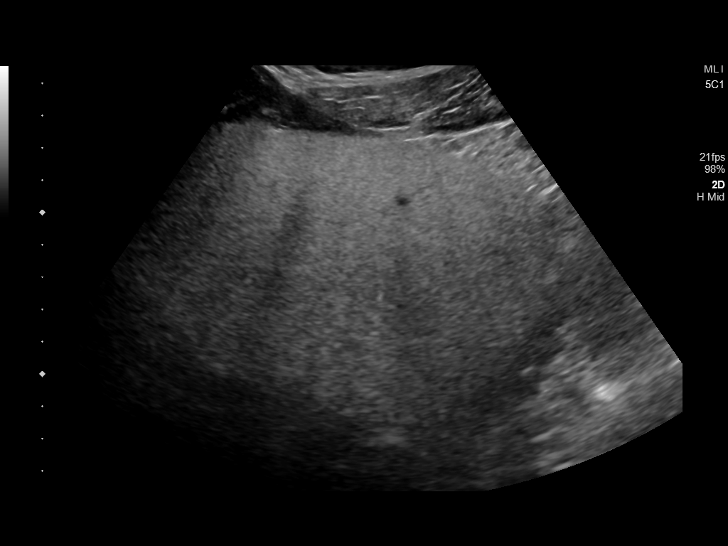
[im 37/56]
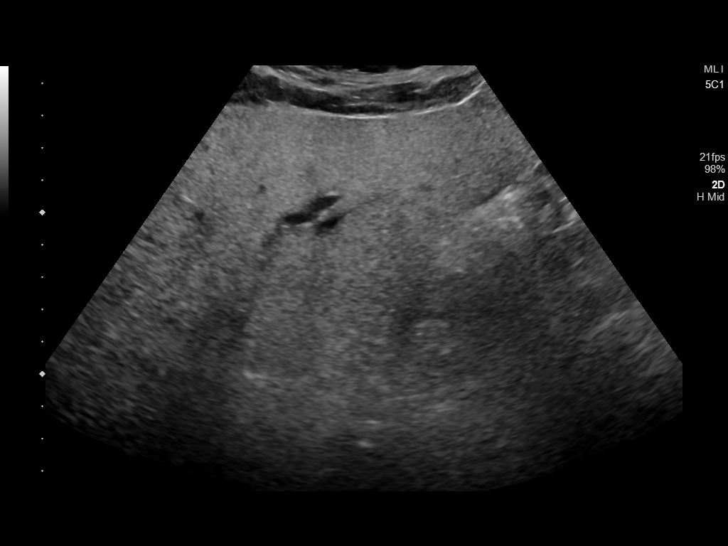
[im 42/56]
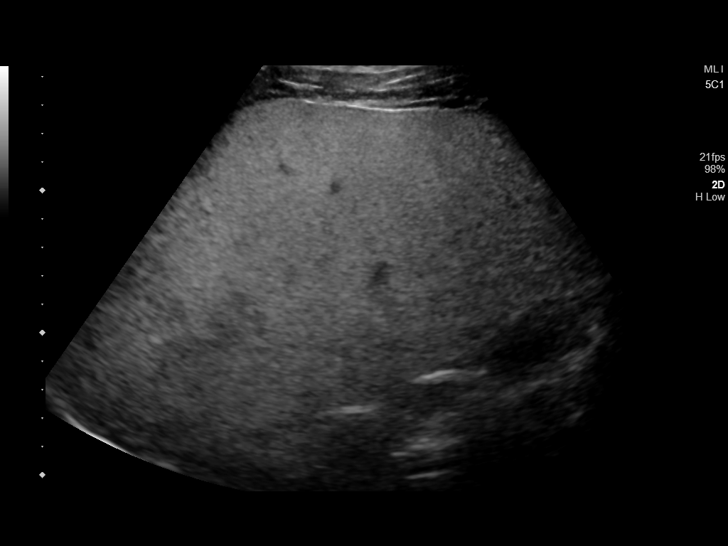
[im 46/56]
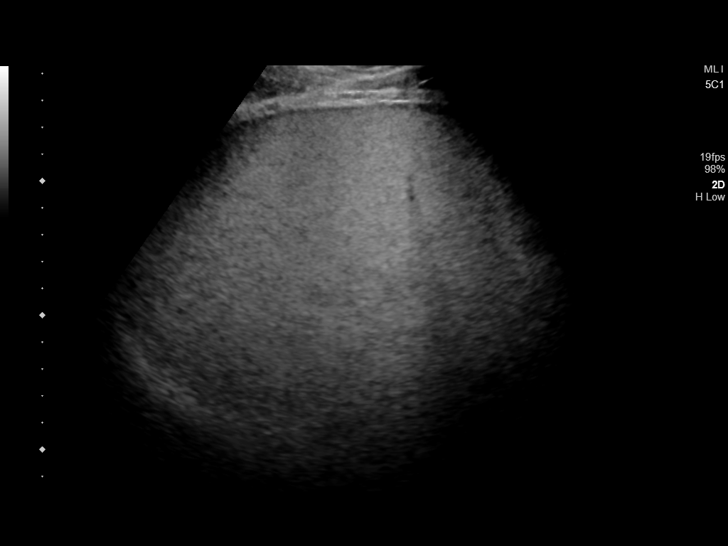
[im 51/56]
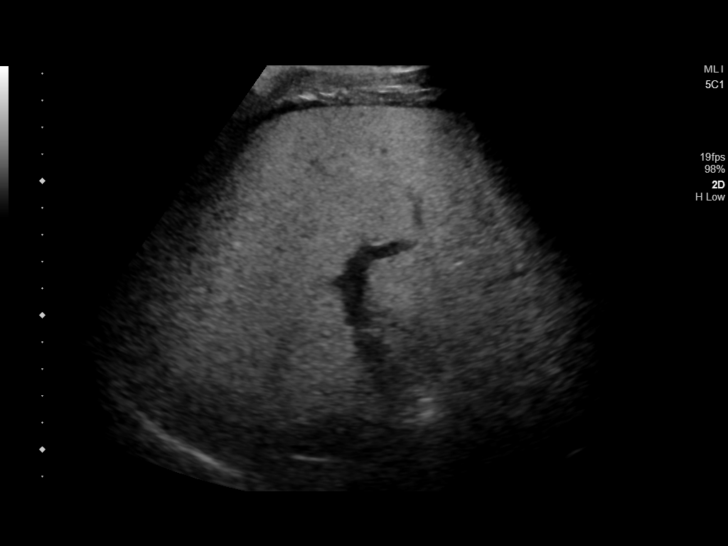
[im 56/56]
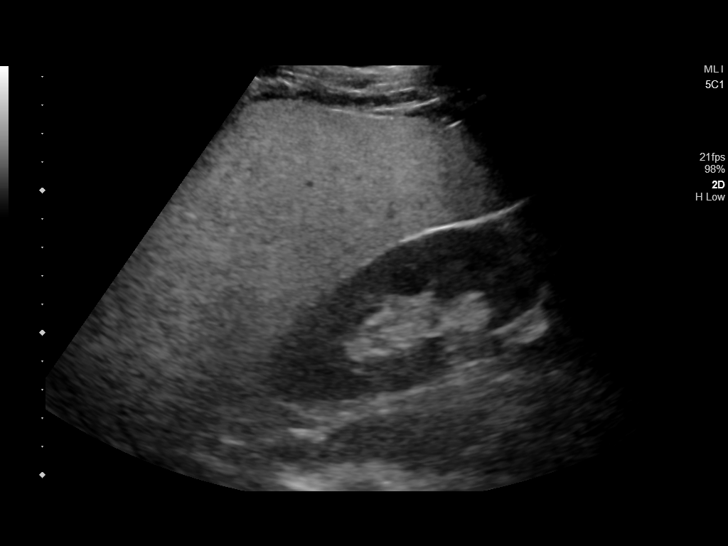

[14 of 25 positions shown; findings below may reference images not displayed]

FINDINGS: Gallbladder:

7 x 5 x 4 mm gallbladder polyp. Mild gallbladder sludge. No
gallstone, gallbladder wall thickening or pericholecystic fluid.
Patient was not tender over the gallbladder during scanning per
sonographer.

Common bile duct:

Diameter: 3.4 mm

Liver:

Diffuse increased echogenicity consistent with diffuse fatty
infiltration as noted on prior CT. Focal fatty sparing adjacent to
gallbladder. No dominant or focal hepatic mass noted. Portal vein is
patent on color Doppler imaging with normal direction of blood flow
towards the liver.
IMPRESSION: Diffuse fatty infiltration liver. No dominant or focal hepatic
lesion noted.

7 x 5 x 4 mm gallbladder polyp. Consensus recommendations for polyp
of this size is follow-up sonogram in 1 year.

Mild gallbladder sludge without gallstone or findings to suggest
gallbladder inflammation.

## 2019-09-05 ENCOUNTER — Encounter: Payer: Self-pay | Admitting: Adult Health

## 2019-09-05 ENCOUNTER — Other Ambulatory Visit: Payer: Self-pay

## 2019-09-05 ENCOUNTER — Ambulatory Visit (INDEPENDENT_AMBULATORY_CARE_PROVIDER_SITE_OTHER): Payer: BC Managed Care – PPO | Admitting: Adult Health

## 2019-09-05 VITALS — BP 132/102 | HR 80 | Temp 96.8°F | Ht 69.0 in | Wt 215.0 lb

## 2019-09-05 DIAGNOSIS — M545 Low back pain, unspecified: Secondary | ICD-10-CM

## 2019-09-05 DIAGNOSIS — Z8679 Personal history of other diseases of the circulatory system: Secondary | ICD-10-CM | POA: Diagnosis not present

## 2019-09-05 DIAGNOSIS — M549 Dorsalgia, unspecified: Secondary | ICD-10-CM | POA: Insufficient documentation

## 2019-09-05 DIAGNOSIS — H00014 Hordeolum externum left upper eyelid: Secondary | ICD-10-CM

## 2019-09-05 DIAGNOSIS — R7401 Elevation of levels of liver transaminase levels: Secondary | ICD-10-CM | POA: Diagnosis not present

## 2019-09-05 DIAGNOSIS — Z0000 Encounter for general adult medical examination without abnormal findings: Secondary | ICD-10-CM | POA: Diagnosis not present

## 2019-09-05 DIAGNOSIS — G8929 Other chronic pain: Secondary | ICD-10-CM

## 2019-09-05 DIAGNOSIS — I10 Essential (primary) hypertension: Secondary | ICD-10-CM | POA: Diagnosis not present

## 2019-09-05 LAB — POCT URINALYSIS DIPSTICK
Bilirubin, UA: NEGATIVE
Blood, UA: NEGATIVE
Glucose, UA: NEGATIVE
Ketones, UA: NEGATIVE
Leukocytes, UA: NEGATIVE
Nitrite, UA: NEGATIVE
Protein, UA: POSITIVE — AB
Spec Grav, UA: 1.025 (ref 1.010–1.025)
Urobilinogen, UA: 0.2 E.U./dL
pH, UA: 5 (ref 5.0–8.0)

## 2019-09-05 LAB — CBC WITH DIFFERENTIAL/PLATELET
EOS (ABSOLUTE): 0.1 10*3/uL (ref 0.0–0.4)
Hematocrit: 43.9 % (ref 37.5–51.0)
Hemoglobin: 15.4 g/dL (ref 13.0–17.7)
Immature Grans (Abs): 0.1 10*3/uL (ref 0.0–0.1)
Lymphs: 32 %
Monocytes Absolute: 0.3 10*3/uL (ref 0.1–0.9)
Neutrophils: 57 %

## 2019-09-05 LAB — COMPREHENSIVE METABOLIC PANEL

## 2019-09-05 LAB — VITAMIN D 25 HYDROXY (VIT D DEFICIENCY, FRACTURES)

## 2019-09-05 LAB — HEMOGLOBIN A1C

## 2019-09-05 LAB — LIPID PANEL W/O CHOL/HDL RATIO

## 2019-09-05 MED ORDER — LOSARTAN POTASSIUM-HCTZ 50-12.5 MG PO TABS: 1.0000 | ORAL_TABLET | Freq: Every day | ORAL | 3 refills | Status: DC

## 2019-09-05 MED ORDER — ERYTHROMYCIN 5 MG/GM OP OINT: 1.0000 "application " | TOPICAL_OINTMENT | Freq: Four times a day (QID) | OPHTHALMIC | 0 refills | Status: DC

## 2019-09-05 MED ORDER — LOSARTAN POTASSIUM-HCTZ 50-12.5 MG PO TABS: 1.0000 | ORAL_TABLET | Freq: Every day | ORAL | 0 refills | Status: DC

## 2019-09-05 NOTE — Progress Notes (Signed)
Michael Stein  MRN: [id number] DOB: 1981-01-12  Subjective:  HPI   The patient is a 38 year old male who presents today to get established with the practice.   Patient has history of hypertension and hyperlipidemia for which he is not currently being treated.   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  He typically goes to the [place] for his back care and is followed there. He denies any new or changing symptoms. Denies any saddle paresthesia or any loss  of bowel/ bladder control.   He has his back injury treated for lower back injury, had herniated disc and he choose physical therapy, motrin PRN. Denies any sciatica. Denies any narcotic use.   Also had Covid two months ago had conjunctivitis then noticed a bump on his upper eye lid  Present x 1 month that has not worsened.Dneies any drainage. He also reports he has not tried any treatments, He did use prescription eye drops for his conjunctivitis and it did resolve.No vision changes.  Patient  denies any fever, body aches,chills, rash, chest pain, shortness of breath, nausea, vomiting, or diarrhea.    Patient Active Problem List   Diagnosis Date Noted  . Vasectomy evaluation 05/04/2014    Past Medical History:  Diagnosis Date  . Back pain due to injury 2007   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  . High cholesterol   . Hypertension     Social History   Socioeconomic History  . Marital status: Married    Spouse name: Not on file  . Number of children: Not on file  . Years of education: Not on file  . Highest education level: Not on file  Occupational History  . Not on file  Tobacco Use  . Smoking status: Former Smoker    Quit date: 05/23/2008    Years since quitting: 11.2  . Smokeless tobacco: Current User  Substance and Sexual Activity  . Alcohol use: Yes    Alcohol/week: 6.0 standard drinks    Types: 6 Cans of beer per week  . Drug use: No  . Sexual activity: Not on file  Other Topics Concern  . Not  on file  Social History Narrative  . Not on file   Social Determinants of Health   Financial Resource Strain:   . Difficulty of Paying Living Expenses: Not on file  Food Insecurity:   . Worried About Charity fundraiser in the Last Year: Not on file  . Ran Out of Food in the Last Year: Not on file  Transportation Needs:   . Lack of Transportation (Medical): Not on file  . Lack of Transportation (Non-Medical): Not on file  Physical Activity:   . Days of Exercise per Week: Not on file  . Minutes of Exercise per Session: Not on file  Stress:   . Feeling of Stress : Not on file  Social Connections:   . Frequency of Communication with Friends and Family: Not on file  . Frequency of Social Gatherings with Friends and Family: Not on file  . Attends Religious Services: Not on file  . Active Member of Clubs or Organizations: Not on file  . Attends Archivist Meetings: Not on file  . Marital Status: Not on file  Intimate Partner Violence:   . Fear of Current or Ex-Partner: Not on file  . Emotionally Abused: Not on file  . Physically Abused: Not on file  . Sexually Abused:  Not on file    Outpatient Encounter Medications as of 09/05/2019  Medication Sig  . ibuprofen (ADVIL) 600 MG tablet Take 600 mg by mouth every 6 (six) hours as needed.  Marland Kitchen. atorvastatin (LIPITOR) 10 MG tablet Take 10 mg by mouth daily.  Marland Kitchen. dicyclomine (BENTYL) 20 MG tablet Take 1 tablet (20 mg total) by mouth 3 (three) times daily as needed for spasms. (Patient not taking: Reported on 09/05/2019)  . naproxen (NAPROSYN) 250 MG tablet Take 1 tablet (250 mg total) by mouth 2 (two) times daily with a meal. (Patient not taking: Reported on 09/05/2019)  . ondansetron (ZOFRAN) 4 MG tablet Take 1 tablet (4 mg total) by mouth every 6 (six) hours as needed for nausea or vomiting. (Patient not taking: Reported on 09/05/2019)   No facility-administered encounter medications on file as of 09/05/2019.    No Known  Allergies  Review of Systems  Constitutional: Negative.   HENT: Negative.   Eyes: Positive for redness. Negative for blurred vision, double vision, photophobia, pain and discharge.       Bump on left eye lid upper   Respiratory: Negative.   Cardiovascular: Negative.   Gastrointestinal: Negative.   Genitourinary: Negative.   Musculoskeletal: Positive for back pain (chronic not new [facility] ). Negative for falls, joint pain, myalgias and neck pain.  Skin: Negative.   Neurological: Positive for headaches.  Endo/Heme/Allergies: Negative.   Psychiatric/Behavioral: Negative.     Objective:  BP (!) 152/98 (BP Location: Left Arm, Patient Position: Sitting, Cuff Size: Normal)   Pulse 80   Temp (!) 96.8 F (36 C) (Skin)   Ht 5\' 9"  (1.753 m)   Wt 215 lb (97.5 kg)   SpO2 97%   BMI 31.75 kg/m    Vitals:   09/05/19 0939 09/05/19 0944  BP: (!) 152/98 (!) 132/102  Pulse: 80   Temp: (!) 96.8 F (36 C)   TempSrc: Skin   SpO2: 97%   Weight: 215 lb (97.5 kg)   Height: 5\' 9"  (1.753 m)      Physical Exam  Constitutional: He is oriented to person, place, and time and well-developed, well-nourished, and in no distress. No distress.  HENT:  Head: Normocephalic and atraumatic.  Eyes: Pupils are equal, round, and reactive to light. Conjunctivae and lids are normal. Right eye exhibits no discharge, no exudate and no hordeolum. Left eye exhibits hordeolum. Left eye exhibits no discharge and no exudate. Right conjunctiva is not injected. Right conjunctiva has no hemorrhage. Left conjunctiva is not injected. Left conjunctiva has no hemorrhage.    Neck: No JVD present. No tracheal deviation present. No thyromegaly present.  Cardiovascular: Normal rate and regular rhythm. Exam reveals no gallop and no friction rub.  No murmur heard. Pulmonary/Chest: Effort normal and breath sounds normal. No stridor. No respiratory distress. He has no wheezes. He has no rales. He exhibits no  tenderness.  Abdominal: Soft. Bowel sounds are normal. He exhibits no distension. There is no abdominal tenderness. There is no rebound and no guarding.  Musculoskeletal:        General: Normal range of motion.     Cervical back: Normal range of motion and neck supple.  Lymphadenopathy:       Head (right side): No submental, no submandibular, no tonsillar, no preauricular, no posterior auricular and no occipital adenopathy present.       Head (left side): No submental, no submandibular, no tonsillar, no preauricular, no posterior auricular and no occipital  adenopathy present.    He has no cervical adenopathy.       Right cervical: No superficial cervical, no deep cervical and no posterior cervical adenopathy present.      Left cervical: No superficial cervical, no deep cervical and no posterior cervical adenopathy present.  Neurological: He is alert and oriented to person, place, and time. He has normal reflexes. He displays normal reflexes. No cranial nerve deficit. He exhibits normal muscle tone. Coordination normal. GCS score is 15.  Skin: Skin is warm and dry. No rash noted. He is not diaphoretic. No erythema. No pallor.  Psychiatric: Mood, memory, affect and judgment normal.    Assessment and Plan :   1. Routine health maintenance  - Lipid Panel w/o Chol/HDL Ratio; Future - HgB A1c; Future - CBC with Differential/Platelet [id number]; Future - Comprehensive Metabolic Panel (CMET); Future - TSH - VITAMIN D 25 Hydroxy (Vit-D Deficiency, Fractures); Future - POCT Urinalysis Dipstick - HIV screening; Future - HIV screening - VITAMIN D 25 Hydroxy (Vit-D Deficiency, Fractures) - Comprehensive Metabolic Panel (CMET) - CBC with Differential/Platelet [id number] - HgB A1c - Lipid Panel w/o Chol/HDL Ratio  2. Hypertension, unspecified type . losartan-hydrochlorothiazide (HYZAAR) 50-12.5 MG tablet    Sig: Take 1 tablet by mouth daily.    Dispense:  30 tablet    Refill:  0  Monitor readings of  blood pressure at home. Report any unusual readings or any new / changing symptoms. RED flags discussed.  - HIV screening; Future - HIV screening  3. Hordeolum externum of left upper eyelid Warm compresses TID. Reviewed after visit summary.  Meds ordered this encounter  . erythromycin ophthalmic ointment    Sig: Place 1 application into both eyes 4 (four) times daily. ( 0.5 % erythromycin ointment).Apply to this strip under affected eyelid  four times daily    Dispense:  3.5 g    Refill:  0  . losartan-hydrochlorothiazide (HYZAAR) 50-12.5 MG tablet    Sig: Take 1 tablet by mouth daily.    Dispense:  30 tablet    Refill:  0    - HIV screening; Future - HIV screening  4. History of hypertension Will treat. Discussed lifestyle management with diet and exercise.  - HIV screening; Future - HIV screening  5. Chronic bilateral low back pain without sciatica Continue to follow up with [facility], let me know if any concerns or changes.    Return in about 1 month (around 10/06/2019), or if symptoms worsen or fail to improve, for Go to Emergency room/ urgent care if worse, Call 911 for emergencies.  Advised patient call the office or your primary care doctor for an appointment if no improvement within 72 hours or if any symptoms change or worsen at any time  Advised ER or urgent Care if after hours or on weekend. Call 911 for emergency symptoms at any time.Patinet verbalized understanding of all instructions given/reviewed and treatment plan and has no further questions or concerns at this time.

## 2019-09-05 NOTE — Progress Notes (Signed)
Reviewed. Urine appeared dehydrated. Will recheck in future.

## 2019-09-05 NOTE — Patient Instructions (Addendum)
Managing Your Hypertension Hypertension is commonly called high blood pressure. This is when the force of your blood pressing against the walls of your arteries is too strong. Arteries are blood vessels that carry blood from your heart throughout your body. Hypertension forces the heart to work harder to pump blood, and may cause the arteries to become narrow or stiff. Having untreated or uncontrolled hypertension can cause heart attack, stroke, kidney disease, and other problems. What are blood pressure readings? A blood pressure reading consists of a higher number over a lower number. Ideally, your blood pressure should be below 120/80. The first ("top") number is called the systolic pressure. It is a measure of the pressure in your arteries as your heart beats. The second ("bottom") number is called the diastolic pressure. It is a measure of the pressure in your arteries as the heart relaxes. What does my blood pressure reading mean? Blood pressure is classified into four stages. Based on your blood pressure reading, your health care provider may use the following stages to determine what type of treatment you need, if any. Systolic pressure and diastolic pressure are measured in a unit called mm Hg. Normal  Systolic pressure: below 120.  Diastolic pressure: below 80. Elevated  Systolic pressure: 120-129.  Diastolic pressure: below 80. Hypertension stage 1  Systolic pressure: 130-139.  Diastolic pressure: 80-89. Hypertension stage 2  Systolic pressure: 140 or above.  Diastolic pressure: 90 or above. What health risks are associated with hypertension? Managing your hypertension is an important responsibility. Uncontrolled hypertension can lead to:  A heart attack.  A stroke.  A weakened blood vessel (aneurysm).  Heart failure.  Kidney damage.  Eye damage.  Metabolic syndrome.  Memory and concentration problems. What changes can I make to manage my  hypertension? Hypertension can be managed by making lifestyle changes and possibly by taking medicines. Your health care provider will help you make a plan to bring your blood pressure within a normal range. Eating and drinking   Eat a diet that is high in fiber and potassium, and low in salt (sodium), added sugar, and fat. An example eating plan is called the DASH (Dietary Approaches to Stop Hypertension) diet. To eat this way: ? Eat plenty of fresh fruits and vegetables. Try to fill half of your plate at each meal with fruits and vegetables. ? Eat whole grains, such as whole wheat pasta, brown rice, or whole grain bread. Fill about one quarter of your plate with whole grains. ? Eat low-fat diary products. ? Avoid fatty cuts of meat, processed or cured meats, and poultry with skin. Fill about one quarter of your plate with lean proteins such as fish, chicken without skin, beans, eggs, and tofu. ? Avoid premade and processed foods. These tend to be higher in sodium, added sugar, and fat.  Reduce your daily sodium intake. Most people with hypertension should eat less than 1,500 mg of sodium a day.  Limit alcohol intake to no more than 1 drink a day for nonpregnant women and 2 drinks a day for men. One drink equals 12 oz of beer, 5 oz of wine, or 1 oz of hard liquor. Lifestyle  Work with your health care provider to maintain a healthy body weight, or to lose weight. Ask what an ideal weight is for you.  Get at least 30 minutes of exercise that causes your heart to beat faster (aerobic exercise) most days of the week. Activities may include walking, swimming, or biking.  Include exercise   to strengthen your muscles (resistance exercise), such as weight lifting, as part of your weekly exercise routine. Try to do these types of exercises for 30 minutes at least 3 days a week.  Do not use any products that contain nicotine or tobacco, such as cigarettes and e-cigarettes. If you need help quitting,  ask your health care provider.  Control any long-term (chronic) conditions you have, such as high cholesterol or diabetes. Monitoring  Monitor your blood pressure at home as told by your health care provider. Your personal target blood pressure may vary depending on your medical conditions, your age, and other factors.  Have your blood pressure checked regularly, as often as told by your health care provider. Working with your health care provider  Review all the medicines you take with your health care provider because there may be side effects or interactions.  Talk with your health care provider about your diet, exercise habits, and other lifestyle factors that may be contributing to hypertension.  Visit your health care provider regularly. Your health care provider can help you create and adjust your plan for managing hypertension. Will I need medicine to control my blood pressure? Your health care provider may prescribe medicine if lifestyle changes are not enough to get your blood pressure under control, and if:  Your systolic blood pressure is 130 or higher.  Your diastolic blood pressure is 80 or higher. Take medicines only as told by your health care provider. Follow the directions carefully. Blood pressure medicines must be taken as prescribed. The medicine does not work as well when you skip doses. Skipping doses also puts you at risk for problems. Contact a health care provider if:  You think you are having a reaction to medicines you have taken.  You have repeated (recurrent) headaches.  You feel dizzy.  You have swelling in your ankles.  You have trouble with your vision. Get help right away if:  You develop a severe headache or confusion.  You have unusual weakness or numbness, or you feel faint.  You have severe pain in your chest or abdomen.  You vomit repeatedly.  You have trouble breathing. Summary  Hypertension is when the force of blood pumping  through your arteries is too strong. If this condition is not controlled, it may put you at risk for serious complications.  Your personal target blood pressure may vary depending on your medical conditions, your age, and other factors. For most people, a normal blood pressure is less than 120/80.  Hypertension is managed by lifestyle changes, medicines, or both. Lifestyle changes include weight loss, eating a healthy, low-sodium diet, exercising more, and limiting alcohol. This information is not intended to replace advice given to you by your health care provider. Make sure you discuss any questions you have with your health care provider. Document Released: 06/02/2012 Document Revised: 12/31/2018 Document Reviewed: 08/06/2016 Elsevier Patient Education  2020 [employer] Hypertension, Adult Hypertension is another name for high blood pressure. High blood pressure forces your heart to work harder to pump blood. This can cause problems over time. There are two numbers in a blood pressure reading. There is a top number (systolic) over a bottom number (diastolic). It is best to have a blood pressure that is below 120/80. Healthy choices can help lower your blood pressure, or you may need medicine to help lower it. What are the causes? The cause of this condition is not known. Some conditions may be related to high blood pressure. What increases  the risk?  Smoking.  Having type 2 diabetes mellitus, high cholesterol, or both.  Not getting enough exercise or physical activity.  Being overweight.  Having too much fat, sugar, calories, or salt (sodium) in your diet.  Drinking too much alcohol.  Having long-term (chronic) kidney disease.  Having a family history of high blood pressure.  Age. Risk increases with age.  Race. You may be at higher risk if you are African American.  Gender. Men are at higher risk than women before age 70. After age 44, women are at higher risk than  men.  Having obstructive sleep apnea.  Stress. What are the signs or symptoms?  High blood pressure may not cause symptoms. Very high blood pressure (hypertensive crisis) may cause: ? Headache. ? Feelings of worry or nervousness (anxiety). ? Shortness of breath. ? Nosebleed. ? A feeling of being sick to your stomach (nausea). ? Throwing up (vomiting). ? Changes in how you see. ? Very bad chest pain. ? Seizures. How is this treated?  This condition is treated by making healthy lifestyle changes, such as: ? Eating healthy foods. ? Exercising more. ? Drinking less alcohol.  Your health care provider may prescribe medicine if lifestyle changes are not enough to get your blood pressure under control, and if: ? Your top number is above 130. ? Your bottom number is above 80.  Your personal target blood pressure may vary. Follow these instructions at home: Eating and drinking   If told, follow the DASH eating plan. To follow this plan: ? Fill one half of your plate at each meal with fruits and vegetables. ? Fill one fourth of your plate at each meal with whole grains. Whole grains include whole-wheat pasta, brown rice, and whole-grain bread. ? Eat or drink low-fat dairy products, such as skim milk or low-fat yogurt. ? Fill one fourth of your plate at each meal with low-fat (lean) proteins. Low-fat proteins include fish, chicken without skin, eggs, beans, and tofu. ? Avoid fatty meat, cured and processed meat, or chicken with skin. ? Avoid pre-made or processed food.  Eat less than 1,500 mg of salt each day.  Do not drink alcohol if: ? Your doctor tells you not to drink. ? You are pregnant, may be pregnant, or are planning to become pregnant.  If you drink alcohol: ? Limit how much you use to:  0-1 drink a day for women.  0-2 drinks a day for men. ? Be aware of how much alcohol is in your drink. In the [place] one drink equals one 12 oz bottle of beer (355 mL), one 5 oz  glass of wine (148 mL), or one 1 oz glass of hard liquor (44 mL). Lifestyle   Work with your doctor to stay at a healthy weight or to lose weight. Ask your doctor what the best weight is for you.  Get at least 30 minutes of exercise most days of the week. This may include walking, swimming, or biking.  Get at least 30 minutes of exercise that strengthens your muscles (resistance exercise) at least 3 days a week. This may include lifting weights or doing Pilates.  Do not use any products that contain nicotine or tobacco, such as cigarettes, e-cigarettes, and chewing tobacco. If you need help quitting, ask your doctor.  Check your blood pressure at home as told by your doctor.  Keep all follow-up visits as told by your doctor. This is important. Medicines  Take over-the-counter and prescription medicines only as  told by your doctor. Follow directions carefully.  Do not skip doses of blood pressure medicine. The medicine does not work as well if you skip doses. Skipping doses also puts you at risk for problems.  Ask your doctor about side effects or reactions to medicines that you should watch for. Contact a doctor if you:  Think you are having a reaction to the medicine you are taking.  Have headaches that keep coming back (recurring).  Feel dizzy.  Have swelling in your ankles.  Have trouble with your vision. Get help right away if you:  Get a very bad headache.  Start to feel mixed up (confused).  Feel weak or numb.  Feel faint.  Have very bad pain in your: ? Chest. ? Belly (abdomen).  Throw up more than once.  Have trouble breathing. Summary  Hypertension is another name for high blood pressure.  High blood pressure forces your heart to work harder to pump blood.  For most people, a normal blood pressure is less than 120/80.  Making healthy choices can help lower blood pressure. If your blood pressure does not get lower with healthy choices, you may need to  take medicine. This information is not intended to replace advice given to you by your health care provider. Make sure you discuss any questions you have with your health care provider. Document Released: 02/25/2008 Document Revised: 05/19/2018 Document Reviewed: 05/19/2018 Elsevier Patient Education  [place]. Hydrochlorothiazide, HCTZ; Losartan tablets What is this medicine? LOSARTAN; HYDROCHLOROTHIAZIDE (loe SAR tan; hye droe klor oh THYE a zide) is a combination of a drug that relaxes blood vessels and a diuretic. It is used to treat high blood pressure. This medicine may also reduce the risk of stroke in certain patients. This medicine may be used for other purposes; ask your health care provider or pharmacist if you have questions. COMMON BRAND NAME(S): Hyzaar What should I tell my health care provider before I take this medicine? They need to know if you have any of these conditions:  decreased urine  diabetes  kidney disease  liver disease  if you are on a special diet, like a low-salt diet  immune system problems, like lupus  an unusual or allergic reaction to losartan, hydrochlorothiazide, sulfa drugs, other medicines, foods, dyes, or preservatives  pregnant or trying to get pregnant  breast-feeding How should I use this medicine? Take this medicine by mouth with a glass of water. Follow the directions on the prescription label. You can take it with or without food. If it upsets your stomach, take it with food. Take your medicine at regular intervals. Do not take it more often than directed. Do not stop taking except on your doctor's advice. Talk to your pediatrician regarding the use of this medicine in children. Special care may be needed. Overdosage: If you think you have taken too much of this medicine contact a poison control center or emergency room at once. NOTE: This medicine is only for you. Do not share this medicine with others. What if I miss a  dose? If you miss a dose, take it as soon as you can. If it is almost time for your next dose, take only that dose. Do not take double or extra doses. What may interact with this medicine?  barbiturates, like phenobarbital  blood pressure medicines  celecoxib  cimetidine  corticosteroids  diabetic medicines  diuretics, especially triamterene, spironolactone or amiloride  fluconazole  lithium  NSAIDs, medicines for pain and inflammation,  like ibuprofen or naproxen  potassium salts or potassium supplements  prescription pain medicines  rifampin  skeletal muscle relaxants like tubocurarine  some cholesterol-lowering medicines like cholestyramine or colestipol This list may not describe all possible interactions. Give your health care provider a list of all the medicines, herbs, non-prescription drugs, or dietary supplements you use. Also tell them if you smoke, drink alcohol, or use illegal drugs. Some items may interact with your medicine. What should I watch for while using this medicine? Check your blood pressure regularly while you are taking this medicine. Ask your doctor or health care professional what your blood pressure should be and when you should contact him or her. When you check your blood pressure, write down the measurements to show your doctor or health care professional. If you are taking this medicine for a long time, you must visit your health care professional for regular checks on your progress. Make sure you schedule appointments on a regular basis. You must not get dehydrated. Ask your doctor or health care professional how much fluid you need to drink a day. Check with him or her if you get an attack of severe diarrhea, nausea and vomiting, or if you sweat a lot. The loss of too much body fluid can make it dangerous for you to take this medicine. Women should inform their doctor if they wish to become pregnant or think they might be pregnant. There is a  potential for serious side effects to an unborn child, particularly in the second or third trimester. Talk to your health care professional or pharmacist for more information. You may get drowsy or dizzy. Do not drive, use machinery, or do anything that needs mental alertness until you know how this drug affects you. Do not stand or sit up quickly, especially if you are an older patient. This reduces the risk of dizzy or fainting spells. Alcohol can make you more drowsy and dizzy. Avoid alcoholic drinks. This medicine may increase blood sugar. Ask your healthcare provider if changes in diet or medicines are needed if you have diabetes. Avoid salt substitutes unless you are told otherwise by your doctor or health care professional. Do not treat yourself for coughs, colds, or pain while you are taking this medicine without asking your doctor or health care professional for advice. Some ingredients may increase your blood pressure. What side effects may I notice from receiving this medicine? Side effects that you should report to your doctor or health care professional as soon as possible:  allergic reactions like skin rash, itching or hives, swelling of the face, lips, or tongue  breathing problems  changes in vision  dark urine  eye pain  fast or irregular heart beat, palpitations, or chest pain  feeling faint or lightheaded  muscle cramps  persistent dry cough  redness, blistering, peeling or loosening of the skin, including inside the mouth   signs and symptoms of high blood sugar such as being more thirsty or hungry or having to urinate more than normal. You may also feel very tired or have blurry vision.  stomach pain  trouble passing urine  unusual bleeding or bruising  worsened gout pain  yellowing of the eyes or skin Side effects that usually do not require medical attention (report to your doctor or health care professional if they continue or are bothersome):  change  in sex drive or performance  headache This list may not describe all possible side effects. Call your doctor for medical advice about  side effects. You may report side effects to FDA at 1-800-FDA-1088. Where should I keep my medicine? Keep out of the reach of children. Store at room temperature between 15 and 30 degrees C (59 and 86 degrees F). Protect from light. Keep container tightly closed. Throw away any unused medicine after the expiration date. NOTE: This sheet is a summary. It may not cover all possible information. If you have questions about this medicine, talk to your doctor, pharmacist, or health care provider.  2020 Elsevier/Gold Standard (2018-06-30 09:42:12)  Erythromycin eye ointment What is this medicine? ERYTHROMYCIN (er ith roe MYE sin) is a macrolide antibiotic. It is used to treat bacterial eye infections. It also prevents a certain type of eye infection that can occur in some babies. This medicine may be used for other purposes; ask your health care provider or pharmacist if you have questions. COMMON BRAND NAME(S): Ilotycin, Romycin What should I tell my health care provider before I take this medicine? if you have an unusual or allergic reaction to erythromycin, foods, dyes, or preservatives pregnant or trying to get pregnant breast-feeding How should I use this medicine? This medicine is only for use in the eye. Follow the directions on the prescription label. Wash hands before and after use. Tilt your head back slightly and pull your lower eyelid down with your index finger to form a pouch. Try not to touch the tip of the tube, to your eye, fingertips, or any other surface. Squeeze the end of the tube to apply a thin layer of the ointment to the inside of the lower eyelid. Close the eye gently to spread the ointment. Your vision may blur for a few minutes. Use your doses at regular intervals. Do not use your medicine more often than directed. Finish the full course  prescribed by your doctor or health care professional even if you think your condition is better. Do not stop using except on the advice of your doctor or health care professional. Talk to your pediatrician regarding the use of this medicine in children. Special care may be needed. Overdosage: If you think you have taken too much of this medicine contact a poison control center or emergency room at once. NOTE: This medicine is only for you. Do not share this medicine with others. What if I miss a dose? If you miss a dose, use it as soon as you can. If it is almost time for your next dose, use only that dose. Do not use double or extra doses. What may interact with this medicine? Interactions are not expected. Do not use any other eye products without telling your doctor or health care professional. This list may not describe all possible interactions. Give your health care provider a list of all the medicines, herbs, non-prescription drugs, or dietary supplements you use. Also tell them if you smoke, drink alcohol, or use illegal drugs. Some items may interact with your medicine. What should I watch for while using this medicine? Tell your doctor or health care professional if your symptoms do not improve in 2 to 3 days. What side effects may I notice from receiving this medicine? Side effects that you should report to your doctor or health care professional as soon as possible: allergic reactions like skin rash, itching or hives, swelling of the face, lips, or tongue burning, stinging, or itching of the eyes or eyelids changes in vision redness, swelling, or pain This list may not describe all possible side effects. Call your doctor  for medical advice about side effects. You may report side effects to FDA at 1-800-FDA-1088. Where should I keep my medicine? Keep out of the reach of children. Store at room temperature between 15 and 30 degrees C (59 and 86 degrees F). Do not freeze. Throw away any  unused ointment after the expiration date. NOTE: This sheet is a summary. It may not cover all possible information. If you have questions about this medicine, talk to your doctor, pharmacist, or health care provider.  2020 Elsevier/Gold Standard (2015-10-11 12:24:24)  Stye  A stye, also known as a hordeolum, is a bump that forms on an eyelid. It may look like a pimple next to the eyelash. A stye can form inside the eyelid (internal stye) or outside the eyelid (external stye). A stye can cause redness, swelling, and pain on the eyelid. Styes are very common. Anyone can get them at any age. They usually occur in just one eye, but you may have more than one in either eye. What are the causes? A stye is caused by an infection. The infection is almost always caused by bacteria called Staphylococcus aureus. This is a common type of bacteria that lives on the skin. An internal stye may result from an infected oil-producing gland inside the eyelid. An external stye may be caused by an infection at the base of the eyelash (hair follicle). What increases the risk? You are more likely to develop a stye if:  You have had a stye before.  You have any of these conditions: ? Diabetes. ? Red, itchy, inflamed eyelids (blepharitis). ? A skin condition such as seborrheic dermatitis or rosacea. ? High fat levels in your blood (lipids). What are the signs or symptoms? The most common symptom of a stye is eyelid pain. Internal styes are more painful than external styes. Other symptoms may include:  Painful swelling of your eyelid.  A scratchy feeling in your eye.  Tearing and redness of your eye.  Pus draining from the stye. How is this diagnosed? Your health care provider may be able to diagnose a stye just by examining your eye. The health care provider may also check to make sure:  You do not have a fever or other signs of a more serious infection.  The infection has not spread to other parts of  your eye or areas around your eye. How is this treated? Most styes will clear up in a few days without treatment or with warm compresses applied to the area. You may need to use antibiotic drops or ointment to treat an infection. In some cases, if your stye does not heal with routine treatment, your health care provider may drain pus from the stye using a thin blade or needle. This may be done if the stye is large, causing a lot of pain, or affecting your vision. Follow these instructions at home:  Take over-the-counter and prescription medicines only as told by your health care provider. This includes eye drops or ointments.  If you were prescribed an antibiotic medicine, apply or use it as told by your health care provider. Do not stop using the antibiotic even if your condition improves.  Apply a warm, wet cloth (warm compress) to your eye for 5-10 minutes, 4 times a day.  Clean the affected eyelid as directed by your health care provider.  Do not wear contact lenses or eye makeup until your stye has healed.  Do not try to pop or drain the stye.  Do  not rub your eye. Contact a health care provider if:  You have chills or a fever.  Your stye does not go away after several days.  Your stye affects your vision.  Your eyeball becomes swollen, red, or painful. Get help right away if:  You have pain when moving your eye around. Summary  A stye is a bump that forms on an eyelid. It may look like a pimple next to the eyelash.  A stye can form inside the eyelid (internal stye) or outside the eyelid (external stye). A stye can cause redness, swelling, and pain on the eyelid.  Your health care provider may be able to diagnose a stye just by examining your eye.  Apply a warm, wet cloth (warm compress) to your eye for 5-10 minutes, 4 times a day. This information is not intended to replace advice given to you by your health care provider. Make sure you discuss any questions you have  with your health care provider. Document Released: 06/18/2005 Document Revised: 08/21/2017 Document Reviewed: 05/21/2017 Elsevier Patient Education  2020 Elsevier Inc.  Hydrochlorothiazide, HCTZ; Losartan tablets What is this medicine? LOSARTAN; HYDROCHLOROTHIAZIDE (loe SAR tan; hye droe klor oh THYE a zide) is a combination of a drug that relaxes blood vessels and a diuretic. It is used to treat high blood pressure. This medicine may also reduce the risk of stroke in certain patients. This medicine may be used for other purposes; ask your health care provider or pharmacist if you have questions. COMMON BRAND NAME(S): Hyzaar What should I tell my health care provider before I take this medicine? They need to know if you have any of these conditions:  decreased urine  diabetes  kidney disease  liver disease  if you are on a special diet, like a low-salt diet  immune system problems, like lupus  an unusual or allergic reaction to losartan, hydrochlorothiazide, sulfa drugs, other medicines, foods, dyes, or preservatives  pregnant or trying to get pregnant  breast-feeding How should I use this medicine? Take this medicine by mouth with a glass of water. Follow the directions on the prescription label. You can take it with or without food. If it upsets your stomach, take it with food. Take your medicine at regular intervals. Do not take it more often than directed. Do not stop taking except on your doctor's advice. Talk to your pediatrician regarding the use of this medicine in children. Special care may be needed. Overdosage: If you think you have taken too much of this medicine contact a poison control center or emergency room at once. NOTE: This medicine is only for you. Do not share this medicine with others. What if I miss a dose? If you miss a dose, take it as soon as you can. If it is almost time for your next dose, take only that dose. Do not take double or extra doses. What  may interact with this medicine?  barbiturates, like phenobarbital  blood pressure medicines  celecoxib  cimetidine  corticosteroids  diabetic medicines  diuretics, especially triamterene, spironolactone or amiloride  fluconazole  lithium  NSAIDs, medicines for pain and inflammation, like ibuprofen or naproxen  potassium salts or potassium supplements  prescription pain medicines  rifampin  skeletal muscle relaxants like tubocurarine  some cholesterol-lowering medicines like cholestyramine or colestipol This list may not describe all possible interactions. Give your health care provider a list of all the medicines, herbs, non-prescription drugs, or dietary supplements you use. Also tell them if you smoke,  drink alcohol, or use illegal drugs. Some items may interact with your medicine. What should I watch for while using this medicine? Check your blood pressure regularly while you are taking this medicine. Ask your doctor or health care professional what your blood pressure should be and when you should contact him or her. When you check your blood pressure, write down the measurements to show your doctor or health care professional. If you are taking this medicine for a long time, you must visit your health care professional for regular checks on your progress. Make sure you schedule appointments on a regular basis. You must not get dehydrated. Ask your doctor or health care professional how much fluid you need to drink a day. Check with him or her if you get an attack of severe diarrhea, nausea and vomiting, or if you sweat a lot. The loss of too much body fluid can make it dangerous for you to take this medicine. Women should inform their doctor if they wish to become pregnant or think they might be pregnant. There is a potential for serious side effects to an unborn child, particularly in the second or third trimester. Talk to your health care professional or pharmacist for more  information. You may get drowsy or dizzy. Do not drive, use machinery, or do anything that needs mental alertness until you know how this drug affects you. Do not stand or sit up quickly, especially if you are an older patient. This reduces the risk of dizzy or fainting spells. Alcohol can make you more drowsy and dizzy. Avoid alcoholic drinks. This medicine may increase blood sugar. Ask your healthcare provider if changes in diet or medicines are needed if you have diabetes. Avoid salt substitutes unless you are told otherwise by your doctor or health care professional. Do not treat yourself for coughs, colds, or pain while you are taking this medicine without asking your doctor or health care professional for advice. Some ingredients may increase your blood pressure. What side effects may I notice from receiving this medicine? Side effects that you should report to your doctor or health care professional as soon as possible:  allergic reactions like skin rash, itching or hives, swelling of the face, lips, or tongue  breathing problems  changes in vision  dark urine  eye pain  fast or irregular heart beat, palpitations, or chest pain  feeling faint or lightheaded  muscle cramps  persistent dry cough  redness, blistering, peeling or loosening of the skin, including inside the mouth   signs and symptoms of high blood sugar such as being more thirsty or hungry or having to urinate more than normal. You may also feel very tired or have blurry vision.  stomach pain  trouble passing urine  unusual bleeding or bruising  worsened gout pain  yellowing of the eyes or skin Side effects that usually do not require medical attention (report to your doctor or health care professional if they continue or are bothersome):  change in sex drive or performance  headache This list may not describe all possible side effects. Call your doctor for medical advice about side effects. You may  report side effects to FDA at 1-800-FDA-1088. Where should I keep my medicine? Keep out of the reach of children. Store at room temperature between 15 and 30 degrees C (59 and 86 degrees F). Protect from light. Keep container tightly closed. Throw away any unused medicine after the expiration date. NOTE: This sheet is a summary. It may  not cover all possible information. If you have questions about this medicine, talk to your doctor, pharmacist, or health care provider.  2020 Elsevier/Gold Standard (2018-06-30 09:42:12)

## 2019-09-06 ENCOUNTER — Telehealth: Payer: Self-pay

## 2019-09-06 LAB — LIPID PANEL W/O CHOL/HDL RATIO
Cholesterol, Total: 190 mg/dL (ref 100–199)
HDL: 26 mg/dL — ABNORMAL LOW (ref >39)
LDL Chol Calc (NIH): 84 mg/dL (ref 0–99)
Triglycerides: 490 mg/dL — ABNORMAL HIGH (ref 0–149)

## 2019-09-06 LAB — COMPREHENSIVE METABOLIC PANEL
ALT: 62 IU/L — ABNORMAL HIGH (ref 0–44)
Bilirubin Total: 0.8 mg/dL (ref 0.0–1.2)
CO2: 25 mmol/L (ref 20–29)
Creatinine, Ser: 0.91 mg/dL (ref 0.76–1.27)
GFR calc non Af Amer: 107 mL/min/{1.73_m2} (ref >59)
Sodium: 142 mmol/L (ref 134–144)

## 2019-09-06 LAB — CBC WITH DIFFERENTIAL/PLATELET
Basophils Absolute: 0 10*3/uL (ref 0.0–0.2)
Basos: 1 %
Eos: 2 %
Immature Granulocytes: 1 %
Lymphocytes Absolute: 1.3 10*3/uL (ref 0.7–3.1)
MCH: 28.8 pg (ref 26.6–33.0)
MCHC: 35.1 g/dL (ref 31.5–35.7)
MCV: 82 fL (ref 79–97)
Monocytes: 7 %
Neutrophils Absolute: 2.3 10*3/uL (ref 1.4–7.0)
Platelets: 218 10*3/uL (ref 150–450)
RBC: 5.35 x10E6/uL (ref 4.14–5.80)
RDW: 12.6 % (ref 11.6–15.4)
WBC: 4.1 10*3/uL (ref 3.4–10.8)

## 2019-09-06 LAB — HIV ANTIBODY (ROUTINE TESTING W REFLEX): HIV Screen 4th Generation wRfx: NONREACTIVE

## 2019-09-06 LAB — HEMOGLOBIN A1C: Est. average glucose Bld gHb Est-mCnc: 105 mg/dL

## 2019-09-06 NOTE — Telephone Encounter (Signed)
-----   Message from Doreen Beam, [place] sent at 09/06/2019  9:10 AM EST ----- Could you see if they could add on a hepatitis panel ( elevated ALT dx code ) and also be sure TSH was performed. Thanks.

## 2019-09-06 NOTE — Telephone Encounter (Signed)
Faxed over authorization form to add hepatitis panel, TSH was not drawn and that has been added on as well. KW

## 2019-09-06 NOTE — Progress Notes (Signed)
Could you see if they could add on a hepatitis panel ( elevated ALT dx code ) and also be sure TSH was performed. Thanks.

## 2019-09-10 LAB — HEPATITIS PANEL, ACUTE
Hep A IgM: NEGATIVE
Hep B C IgM: NEGATIVE
Hep C Virus Ab: <0.1 s/co ratio (ref 0.0–0.9)
Hepatitis B Surface Ag: NEGATIVE

## 2019-09-10 LAB — TSH: TSH: 3.54 u[IU]/mL (ref 0.450–4.500)

## 2019-09-10 LAB — SPECIMEN STATUS REPORT

## 2019-09-12 ENCOUNTER — Telehealth: Payer: Self-pay | Admitting: Adult Health

## 2019-09-12 DIAGNOSIS — E559 Vitamin D deficiency, unspecified: Secondary | ICD-10-CM

## 2019-09-12 NOTE — Telephone Encounter (Signed)
From [facility] 

## 2019-09-12 NOTE — Progress Notes (Signed)
Hepatitis A,B, C panel is negative. Thyroid lab normal.  HIV screen negative.  Vitamin D is low at 11.6. I will send over prescription Vitamin D 50,000 international units take one tablet one a week for 8 weeks. Recheck lab in 2-3 months.  Glucose was mildly elevated 105, monitor diet for high sugar  foods, carbohydrates and drinks with high sugars.Hemoglobin A1C average of 3 month blood glucose is normal. LDL elevated.  Discuss lifestyle modification with patient e.g. increase exercise, fiber, fruits, vegetables, lean meat, and omega 3/fish intake and decrease saturated fat.  If patient following strict diet and exercise program already please schedule follow up appointment with primary care physician Triglycerides are elevated at 490- needs low triglyceride diet and add in over the counter fish oil  omega 3 to help lower .  HDL good cholesterol is low, we like this to be over 30 his is 26. Increase good healthy fats in diet as well as green and purple vegetables and fruits.  I recommend aggressive dietary and lifestyle changes increased exercise. Recheck labs in 6 months with follow up.   If he signs up for Mychart send triglyceride, vitamin d, cholesterol diet information.

## 2019-09-12 NOTE — Telephone Encounter (Signed)
Patient returned call and was read lab note of Michael Stein 09/12/2019. He verbalized understanding and states he will call back for lab appointment 2-3 months for recheck of Vit D. Patient has not received RX for Vit D. Please send RX. He states he will sign up for My chart and would like the dietary information you suggested.

## 2019-09-13 MED ORDER — VITAMIN D (ERGOCALCIFEROL) 1.25 MG (50000 UNIT) PO CAPS: 50000.0000 [IU] | ORAL_CAPSULE | ORAL | 0 refills | Status: DC

## 2019-09-13 NOTE — Addendum Note (Signed)
Addended by: Doreen Beam on: 09/13/2019 01:46 PM   Modules accepted: Orders

## 2019-10-06 ENCOUNTER — Encounter: Payer: Self-pay | Admitting: Adult Health

## 2019-10-06 ENCOUNTER — Other Ambulatory Visit: Payer: Self-pay

## 2019-10-06 ENCOUNTER — Ambulatory Visit (INDEPENDENT_AMBULATORY_CARE_PROVIDER_SITE_OTHER): Payer: BC Managed Care – PPO | Admitting: Adult Health

## 2019-10-06 VITALS — BP 138/88 | HR 74 | Temp 96.8°F | Resp 16 | Wt 219.0 lb

## 2019-10-06 DIAGNOSIS — I10 Essential (primary) hypertension: Secondary | ICD-10-CM | POA: Diagnosis not present

## 2019-10-06 DIAGNOSIS — K219 Gastro-esophageal reflux disease without esophagitis: Secondary | ICD-10-CM | POA: Diagnosis not present

## 2019-10-06 DIAGNOSIS — E559 Vitamin D deficiency, unspecified: Secondary | ICD-10-CM | POA: Diagnosis not present

## 2019-10-06 DIAGNOSIS — H00014 Hordeolum externum left upper eyelid: Secondary | ICD-10-CM | POA: Diagnosis not present

## 2019-10-06 DIAGNOSIS — E781 Pure hyperglyceridemia: Secondary | ICD-10-CM

## 2019-10-06 DIAGNOSIS — E669 Obesity, unspecified: Secondary | ICD-10-CM

## 2019-10-06 MED ORDER — LOSARTAN POTASSIUM-HCTZ 50-12.5 MG PO TABS: 1.0000 | ORAL_TABLET | Freq: Every day | ORAL | 0 refills | Status: AC

## 2019-10-06 MED ORDER — OMEPRAZOLE 20 MG PO CPDR: 20.0000 mg | DELAYED_RELEASE_CAPSULE | Freq: Every day | ORAL | 0 refills | Status: AC

## 2019-10-06 NOTE — Progress Notes (Addendum)
Patient: Michael Stein Male    DOB: 1980/11/12   39 y.o.   MRN: [id number] Visit Date: 10/06/2019  Today's Provider: Jairo Ben, FNP   Chief Complaint  Patient presents with  . Hypertension   Subjective:     HPI  Hypertension, follow-up:  BP Readings from Last 3 Encounters:  10/06/19 138/88  09/05/19 (!) 132/102  05/27/15 (!) 139/95    He was last seen for hypertension 1 months ago.  BP at that visit was 132/102. Management changes since that visit include starting patient on Losartan-HCTZ 50-12.5mg . He reports excellent compliance with treatment. He is not having side effects.  He is not exercising. He is adherent to low salt diet.   Outside blood pressures are not being checked.  Patient denies chest pain, claudication, dyspnea, exertional chest pressure/discomfort, fatigue, irregular heart beat, lower extremity edema, near-syncope, orthopnea, palpitations, paroxysmal nocturnal dyspnea, syncope and tachypnea.   Cardiovascular risk factors include hypertension and male gender.  Use of agents associated with hypertension: none.   Reports back pain has improved since last visit, declines any concerns today related to this.  Denies paresthesias, saddle paresthesia, or any loss of bowel or bladder control. He has a burning/ pain in his upper abdomen. Denies any acid taste in mouth. History of reflux in past. He reports he notices it more with eating spicy foods of which he eats a lot of. He sometimes takes aspirin or ibuprofen. Denies rectal or oral bleeding.  Denies any radiating pain, chest discomfort, dysphagia.   Right eye internal hordeolum not improving, getting larger,  he is doing erythromycin ointment and multiple warm compresses as advised at last visit. No vision changes, or eye pain.  Weight trend: stable Wt Readings from Last 3 Encounters:  10/06/19 219 lb (99.3 kg)  09/05/19 215 lb (97.5 kg)  02/25/15 205 lb (93 kg)    Current diet: well  balanced Patient  denies any fever, body aches,chills, rash, chest pain, shortness of breath, nausea, vomiting, or diarrhea.   ------------------------------------------------------------------------  No Known Allergies   Review of Systems  Constitutional: Negative.   HENT: Negative.   Eyes: Positive for pain (stye eyelid pain/ irritation. ). Negative for photophobia, discharge, redness, itching and visual disturbance.       " stye not improving "   Respiratory: Negative.   Cardiovascular: Negative.   Gastrointestinal: Positive for abdominal pain. Negative for abdominal distention, anal bleeding, blood in stool, constipation, diarrhea, nausea, rectal pain and vomiting.       Worse with spicey foods " burning "   Genitourinary: Negative.   Musculoskeletal: Positive for back pain. Negative for arthralgias, gait problem, joint swelling, myalgias, neck pain and neck stiffness.  Skin: Negative.   Psychiatric/Behavioral: Negative.     Social History   Tobacco Use  . Smoking status: Former Smoker    Quit date: 05/23/2008    Years since quitting: 11.3  . Smokeless tobacco: Current User  Substance Use Topics  . Alcohol use: Yes    Alcohol/week: 6.0 standard drinks    Types: 6 Cans of beer per week      Objective:   BP 138/88   Pulse 74   Temp (!) 96.8 F (36 C) (Oral)   Resp 16   Wt 219 lb (99.3 kg)   SpO2 98%   BMI 32.34 kg/m  Vitals:   10/06/19 1019  BP: 138/88  Pulse: 74  Resp: 16  Temp: (!) 96.8 F (36 C)  TempSrc: Oral  SpO2: 98%  Weight: 219 lb (99.3 kg)  Body mass index is 32.34 kg/m.   Physical Exam Vitals and nursing note reviewed.  Constitutional:      General: He is not in acute distress.    Appearance: Normal appearance. He is well-developed. He is obese. He is not ill-appearing, toxic-appearing or diaphoretic.     Comments: Patient is alert and oriented and responsive to questions Engages in eye contact with provider. Speaks in full sentences  without any pauses without any shortness of breath or distress.    HENT:     Head: Normocephalic and atraumatic.     Right Ear: Hearing, tympanic membrane, ear canal and external ear normal.     Left Ear: Hearing, tympanic membrane, ear canal and external ear normal.     Nose: Nose normal.     Mouth/Throat:     Pharynx: Uvula midline. No oropharyngeal exudate.  Eyes:     General: Lids are normal. No scleral icterus.       Right eye: No discharge.        Left eye: Hordeolum present.No discharge.     Extraocular Movements: Extraocular movements intact.     Conjunctiva/sclera: Conjunctivae normal.     Pupils: Pupils are equal, round, and reactive to light.      Comments: Reportedly on left upper eyelid internal.  Eye exam is normal.  Increased in size from last visit.  Tender to the touch.  Neck:     Thyroid: No thyromegaly.     Vascular: Normal carotid pulses. No carotid bruit, hepatojugular reflux or JVD.     Trachea: Trachea and phonation normal. No tracheal tenderness or tracheal deviation.     Meningeal: Brudzinski's sign absent.  Cardiovascular:     Rate and Rhythm: Normal rate and regular rhythm.     Pulses: Normal pulses.     Heart sounds: Normal heart sounds, S1 normal and S2 normal. Heart sounds not distant. No murmur. No friction rub. No gallop.   Pulmonary:     Effort: Pulmonary effort is normal. No accessory muscle usage or respiratory distress.     Breath sounds: Normal breath sounds. No stridor. No wheezing, rhonchi or rales.  Chest:     Chest wall: No tenderness.  Abdominal:     General: Bowel sounds are normal. There is no distension.     Palpations: Abdomen is soft. There is no mass.     Tenderness: There is no abdominal tenderness. There is no guarding or rebound.     Hernia: No hernia is present.  Musculoskeletal:        General: No tenderness or deformity. Normal range of motion.     Cervical back: Full passive range of motion without pain, normal range of  motion and neck supple.     Comments: Patient moves on and off of exam table and in room without difficulty. Gait is normal in hall and in room. Patient is oriented to person place time and situation. Patient answers questions appropriately and engages in conversation.   Lymphadenopathy:     Head:     Right side of head: No submental, submandibular, tonsillar, preauricular, posterior auricular or occipital adenopathy.     Left side of head: No submental, submandibular, tonsillar, preauricular, posterior auricular or occipital adenopathy.     Cervical: No cervical adenopathy.  Skin:    General: Skin is warm and dry.     Capillary Refill: Capillary refill takes less than 2 seconds.  Coloration: Skin is not pale.     Findings: No erythema or rash.     Nails: There is no clubbing.  Neurological:     Mental Status: He is alert and oriented to person, place, and time.     GCS: GCS eye subscore is 4. GCS verbal subscore is 5. GCS motor subscore is 6.     Cranial Nerves: No cranial nerve deficit.     Sensory: No sensory deficit.     Motor: No abnormal muscle tone.     Coordination: Coordination normal.     Gait: Gait normal.     Deep Tendon Reflexes: Reflexes are normal and symmetric. Reflexes normal.  Psychiatric:        Speech: Speech normal.        Behavior: Behavior normal.        Thought Content: Thought content normal.        Judgment: Judgment normal.      No results found for any visits on 10/06/19.     Assessment & Plan   \ 1. Vitamin D deficiency We will recheck in 3 months.  Continue medication as discussed prescription and then start vitamin D3 4000 international units once daily by mouth. - VITAMIN D 25 Hydroxy (Vit-D Deficiency, Fractures)  2. Hypertension, unspecified type Meds ordered this encounter  Medications  . omeprazole (PRILOSEC) 20 MG capsule    Sig: Take 1 capsule (20 mg total) by mouth daily.    Dispense:  90 capsule    Refill:  0  .  losartan-hydrochlorothiazide (HYZAAR) 50-12.5 MG tablet    Sig: Take 1 tablet by mouth daily.    Dispense:  90 tablet    Refill:  0  Will continue losartan hydrochlorothiazide at continued dose has had improvement in blood pressure, increased exercise and diet control as well. - Comprehensive Metabolic Panel (CMET)  3. Hordeolum externum of left upper eyelid Failed treatment with multiple warm compresses, as well as erythromycin ointment.  [facility] referral was placed and patient is in agreement.  Orally and is enlarging in size. - Ambulatory referral to Ophthalmology  4. Gastroesophageal reflux disease without esophagitis We will start medication as below, discussed side effects, discussed avoiding trigger foods spicy foods. - omeprazole (PRILOSEC) 20 MG capsule; Take 1 capsule (20 mg total) by mouth daily.  Dispense: 90 capsule; Refill: 0  5. High triglycerides Discussed diet plan and Fish per dosing guidelines.  We will recheck at next visit to see if any improvement.  Increased exercise.  Increased green leafy vegetables and purple foods to help increase HDLs as well as exercise and healthy lifestyle. - Lipid Panel w/o Chol/HDL Ratio  6. Obesity (BMI 30.0-34.9) Again reinforced healthy lifestyle, diet and increased exercise.  Patient verbalized understanding.  Advised patient call the office or your primary care doctor for an appointment if no improvement within 72 hours or if any symptoms change or worsen at any time  Advised ER or urgent Care if after hours or on weekend. Call 911 for emergency symptoms at any time.Patinet verbalized understanding of all instructions given/reviewed and treatment plan and has no further questions or concerns at this time.       Return in about 3 months (around 01/04/2020), or if symptoms worsen or fail to improve, for at any time for any worsening symptoms, Go to Emergency room/ urgent care if worse. The entirety of the information  documented in the History of Present Illness, Review of Systems and Physical Exam  were personally obtained by me. Portions of this information were initially documented by the  Certified Medical Assistant whose name is documented in [place] and reviewed by me for thoroughness and accuracy.  I have personally performed the exam and reviewed the chart and it is accurate to the best of my knowledge.  Haematologist has been used and any errors in dictation or transcription are unintentional.  The entirety of the information documented in the History of Present Illness, Review of Systems and Physical Exam were personally obtained by me. Portions of this information were initially documented by the  Certified Medical Assistant whose name is documented in [place] and reviewed by me for thoroughness and accuracy.  I have personally performed the exam and reviewed the chart and it is accurate to the best of my knowledge.  Haematologist has been used and any errors in dictation or transcription are unintentional.  Kelby Aline. [place]. [place], [facility]

## 2019-10-06 NOTE — Patient Instructions (Addendum)
Fish Oil, Omega-3 Fatty Acids capsules (OTC) What is this medicine? FISH OIL, OMEGA-3 FATTY ACIDS (Fish Oil, oh MAY ga - 3 fatty AS ids) are essential fats. It is promoted to help support a healthy heart. This dietary supplement is used to add to a healthy diet. The FDA has not approved this supplement for any medical use. This supplement may be used for other purposes; ask your health care provider or pharmacist if you have questions. This medicine may be used for other purposes; ask your health care provider or pharmacist if you have questions. COMMON BRAND NAME(S): Omega-3, Omega-3 Fish Oil, OMEGA-3 IQ DHA, TherOmega, THEROMEGA SPORT What should I tell my health care provider before I take this medicine? They need to know if you have any of these conditions  bleeding problems  lung or breathing disease, like asthma  an unusual or allergic reaction to fish oil, omega-3 fatty acids, fish, other medicines, foods, dyes, or preservatives  pregnant or trying to get pregnant  breast-feeding How should I use this medicine? Take this medicine by mouth with a glass of water. Follow the directions on the package or prescription label. Take with food. Take your medicine at regular intervals. Do not take your medicine more often than directed. Talk to your pediatrician regarding the use of this medicine in children. Special care may be needed. This medicine should not be used in children without a doctor's advice. Overdosage: If you think you have taken too much of this medicine contact a poison control center or emergency room at once. NOTE: This medicine is only for you. Do not share this medicine with others. What if I miss a dose? If you miss a dose, take it as soon as you can. If it is almost time for your next dose, take only that dose. Do not take double or extra doses. What may interact with this medicine?  aspirin and aspirin-like medicines  herbal products like danshen, dong quai, garlic  pills, ginger, ginkgo biloba, horse chestnut, willow bark, and others  medicines that treat or prevent blood clots like enoxaparin, heparin, warfarin This list may not describe all possible interactions. Give your health care provider a list of all the medicines, herbs, non-prescription drugs, or dietary supplements you use. Also tell them if you smoke, drink alcohol, or use illegal drugs. Some items may interact with your medicine. What should I watch for while using this medicine? Follow a good diet and exercise plan. Taking a dietary supplement does not replace a healthy lifestyle. Some foods that have omega-3 fatty acids naturally are fatty fish like albacore tuna, halibut, herring, mackerel, lake trout, salmon, and sardines. Too much of this supplement can be unsafe. Talk to your doctor or health care provider about how much of this supplement is right for you. If you are scheduled for any medical or dental procedure, tell your healthcare provider that you are taking this medicine. You may need to stop taking this medicine before the procedure. Herbal or dietary supplements are not regulated like medicines. Rigid quality control standards are not required for dietary supplements. The purity and strength of these products can vary. The safety and effect of this dietary supplement for a certain disease or illness is not well known. This product is not intended to diagnose, treat, cure or prevent any disease. The Food and Drug Administration suggests the following to help consumers protect themselves:  Always read product labels and follow directions.  Natural does not mean a product is  safe for humans to take.  Look for products that include USP after the ingredient name. This means that the manufacturer followed the standards of the [place] Pharmacopoeia.  Supplements made or sold by a nationally known food or drug company are more likely to be made under tight controls. You can write to the company  for more information about how the product was made. What side effects may I notice from receiving this medicine? Side effects that you should report to your doctor or health care professional as soon as possible:  allergic reactions like skin rash, itching or hives, swelling of the face, lips, or tongue  breathing problems  changes in your moods or emotions  unusual bleeding or bruising Side effects that usually do not require medical attention (report to your doctor or health care professional if they continue or are bothersome):  bad or fishy breath  belching  diarrhea  nausea  stomach gas, upset  weight gain This list may not describe all possible side effects. Call your doctor for medical advice about side effects. You may report side effects to FDA at 1-800-FDA-1088. Where should I keep my medicine? Keep out of the reach of children. Store at room temperature or as directed on the package label. Protect from moisture. Do not freeze. Throw away any unused medicine after the expiration date. NOTE: This sheet is a summary. It may not cover all possible information. If you have questions about this medicine, talk to your doctor, pharmacist, or health care provider.  2020 Elsevier/Gold Standard (2014-12-28 09:36:32) High Triglycerides Eating Plan Triglycerides are a type of fat in the blood. High levels of triglycerides can increase your risk of heart disease and stroke. If your triglyceride levels are high, choosing the right foods can help lower your triglycerides and keep your heart healthy. Work with your health care provider or a diet and nutrition specialist (dietitian) to develop an eating plan that is right for you. What are tips for following this plan? General guidelines   Lose weight, if you are overweight. For most people, losing 5-10 lbs (2-5 kg) helps lower triglyceride levels. A weight-loss plan may include. ? 30 minutes of exercise at least 5 days a  week. ? Reducing the amount of calories, sugar, and fat you eat.  Eat a wide variety of fresh fruits, vegetables, and whole grains. These foods are high in fiber.  Eat foods that contain healthy fats, such as fatty fish, nuts, seeds, and olive oil.  Avoid foods that are high in added sugar, added salt (sodium), saturated fat, and trans fat.  Avoid low-fiber, refined carbohydrates such as white bread, crackers, noodles, and white rice.  Avoid foods with partially hydrogenated oils (trans fats), such as fried foods or stick margarine.  Limit alcohol intake to no more than 1 drink a day for nonpregnant women and 2 drinks a day for men. One drink equals 12 oz of beer, 5 oz of wine, or 1 oz of hard liquor. Your health care provider may recommend that you drink less depending on your overall health. Reading food labels  Check food labels for the amount of saturated fat. Choose foods with no or very little saturated fat.  Check food labels for the amount of trans fat. Choose foods with no trans fat.  Check food labels for the amount of cholesterol. Choose foods low in cholesterol. Ask your dietitian how much cholesterol you should have each day.  Check food labels for the amount of sodium. Choose  foods with less than 140 milligrams (mg) per serving. Shopping  Buy dairy products labeled as nonfat (skim) or low-fat (1%).  Avoid buying processed or prepackaged foods. These are often high in added sugar, sodium, and fat. Cooking  Choose healthy fats when cooking, such as olive oil or canola oil.  Cook foods using lower fat methods, such as baking, broiling, boiling, or grilling.  Make your own sauces, dressings, and marinades when possible, instead of buying them. Store-bought sauces, dressings, and marinades are often high in sodium and sugar. Meal planning  Eat more home-cooked food and less restaurant, buffet, and fast food.  Eat fatty fish at least 2 times each week. Examples of  fatty fish include salmon, trout, mackerel, tuna, and herring.  If you eat whole eggs, do not eat more than 3 egg yolks per week. What foods are recommended? The items listed may not be a complete list. Talk with your dietitian about what dietary choices are best for you. Grains Whole wheat or whole grain breads, crackers, cereals, and pasta. Unsweetened oatmeal. Bulgur. Barley. Quinoa. Brown rice. Whole wheat flour tortillas. Vegetables Fresh or frozen vegetables. Low-sodium canned vegetables. Fruits All fresh, canned (in natural juice), or frozen fruits. Meats and other protein foods Skinless chicken or [place]. Ground chicken or [place]. Lean cuts of pork, trimmed of fat. Fish and seafood, especially salmon, trout, and herring. Egg whites. Dried beans, peas, or lentils. Unsalted nuts or seeds. Unsalted canned beans. Natural peanut or almond butter. Dairy Low-fat dairy products. Skim or low-fat (1%) milk. Reduced fat (2%) and low-sodium cheese. Low-fat ricotta cheese. Low-fat cottage cheese. Plain, low-fat yogurt. Fats and oils Tub margarine without trans fats. Light or reduced-fat mayonnaise. Light or reduced-fat salad dressings. Avocado. Safflower, olive, sunflower, soybean, and canola oils. What foods are not recommended? The items listed may not be a complete list. Talk with your dietitian about what dietary choices are best for you. Grains White bread. White (regular) pasta. White rice. Cornbread. Bagels. Pastries. Crackers that contain trans fat. Vegetables Creamed or fried vegetables. Vegetables in a cheese sauce. Fruits Sweetened dried fruit. Canned fruit in syrup. Fruit juice. Meats and other protein foods Fatty cuts of meat. Ribs. Chicken wings. Tomasa BlaseBacon. Sausage. Bologna. Salami. Chitterlings. Fatback. Hot dogs. Bratwurst. Packaged lunch meats. Dairy Whole or reduced-fat (2%) milk. Half-and-half. Cream cheese. Full-fat or sweetened yogurt. Full-fat cheese. Nondairy creamers.  Whipped toppings. Processed cheese or cheese spreads. Cheese curds. Beverages Alcohol. Sweetened drinks, such as soda, lemonade, fruit drinks, or punches. Fats and oils Butter. Stick margarine. Lard. Shortening. Ghee. Bacon fat. Tropical oils, such as coconut, palm kernel, or palm oils. Sweets and desserts Corn syrup. Sugars. Honey. Molasses. Candy. Jam and jelly. Syrup. Sweetened cereals. Cookies. Pies. Cakes. Donuts. Muffins. Ice cream. Condiments Store-bought sauces, dressings, and marinades that are high in sugar, such as ketchup and barbecue sauce. Summary  High levels of triglycerides can increase the risk of heart disease and stroke. Choosing the right foods can help lower your triglycerides.  Eat plenty of fresh fruits, vegetables, and whole grains. Choose low-fat dairy and lean meats. Eat fatty fish at least twice a week.  Avoid processed and prepackaged foods with added sugar, sodium, saturated fat, and trans fat.  If you need suggestions or have questions about what types of food are good for you, talk with your health care provider or a dietitian. This information is not intended to replace advice given to you by your health care provider. Make sure you discuss any  questions you have with your health care provider. Document Revised: 08/21/2017 Document Reviewed: 11/11/2016 Elsevier Patient Education  2020 Elsevier Inc.  Omeprazole tablets (OTC) What is this medicine? OMEPRAZOLE (oh ME pray zol) prevents the production of acid in the stomach. It is used to treat the symptoms of heartburn. You can buy this medicine without a prescription. This product is not for long-term use, unless otherwise directed by your doctor or health care professional. This medicine may be used for other purposes; ask your health care provider or pharmacist if you have questions. COMMON BRAND NAME(S): Prilosec OTC What should I tell my health care provider before I take this medicine? They need to  know if you have any of these conditions:  black or bloody stools  chest pain  difficulty swallowing  have had heartburn for over 3 months  have heartburn with dizziness, lightheadedness or sweating  liver disease  lupus  stomach pain  unexplained weight loss  vomiting with blood  wheezing  an unusual or allergic reaction to omeprazole, other medicines, foods, dyes, or preservatives  pregnant or trying to get pregnant  breast-feeding How should I use this medicine? Take this medicine by mouth with a glass of water. Follow the directions on the product label. Do not cut, crush or chew this medicine. Swallow the tablets whole. Take this medicine on an empty stomach, at least 30 minutes before breakfast. Take your medicine at regular intervals. Do not take it more often than directed. Talk to your pediatrician regarding the use of this medicine in children. Special care may be needed. Overdosage: If you think you have taken too much of this medicine contact a poison control center or emergency room at once. NOTE: This medicine is only for you. Do not share this medicine with others. What if I miss a dose? If you miss a dose, take it as soon as you can. If it is almost time for your next dose, take only that dose. Do not take double or extra doses. What may interact with this medicine? Do not take this medicine with any of the following medications:  atazanavir  clopidogrel  nelfinavir  rilpivirine This medicine may also interact with the following medications:  antifungals like itraconazole, ketoconazole, and voriconazole  certain antivirals for HIV or hepatitis  certain medicines that treat or prevent blood clots like warfarin  cilostazol  citalopram  cyclosporine  dasatinib  digoxin  disulfiram  diuretics  erlotinib  iron supplements  medicines for anxiety, panic, and sleep like diazepam  medicines for seizures like carbamazepine,  phenobarbital, phenytoin  methotrexate  mycophenolate mofetil  nilotinib  rifampin  St. John's wort  tacrolimus  vitamin B12 This list may not describe all possible interactions. Give your health care provider a list of all the medicines, herbs, non-prescription drugs, or dietary supplements you use. Also tell them if you smoke, drink alcohol, or use illegal drugs. Some items may interact with your medicine. What should I watch for while using this medicine? It can take several days before your heartburn gets better. Tell your healthcare professional if your symptoms do not start to get better or if they get worse. If you need to take this medicine for more than 14 days, talk to your healthcare professional. Heartburn may sometimes be caused by a more serious condition. This medicine may cause a decrease in vitamin B12. You should make sure that you get enough vitamin B12 while you are taking this medicine. Discuss the foods you eat  and the vitamins you take with your health care professional. What side effects may I notice from receiving this medicine? Side effects that you should report to your doctor or health care professional as soon as possible:  allergic reactions like skin rash, itching or hives, swelling of the face, lips, or tongue  bone pain  breathing problems  fever or sore throat  joint pain  rash on cheeks or arms that gets worse in the sun  redness, blistering, peeling, or loosening of the skin, including inside the mouth  severe diarrhea  signs and symptoms of kidney injury like trouble passing urine or change in the amount of urine  signs and symptoms of low magnesium like muscle cramps; muscle pain; muscle weakness; tremors; seizures; or fast, irregular heartbeat  stomach polyps  unusual bleeding or bruising Side effects that usually do not require medical attention (report to your doctor or health care professional if they continue or are  bothersome):  diarrhea  dry mouth  gas  headache  nausea  stomach pain This list may not describe all possible side effects. Call your doctor for medical advice about side effects. You may report side effects to FDA at 1-800-FDA-1088. Where should I keep my medicine? Keep out of the reach of children. Store at room temperature between 20 and 25 degrees C (68 and 77 degrees F). Protect from light and moisture. Throw away any unused medicine after the expiration date. NOTE: This sheet is a summary. It may not cover all possible information. If you have questions about this medicine, talk to your doctor, pharmacist, or health care provider.  2020 Elsevier/Gold Standard (2018-06-30 13:06:30)  Food Choices for Gastroesophageal Reflux Disease, Adult When you have gastroesophageal reflux disease (GERD), the foods you eat and your eating habits are very important. Choosing the right foods can help ease your discomfort. Think about working with a nutrition specialist (dietitian) to help you make good choices. What are tips for following this plan?  Meals  Choose healthy foods that are low in fat, such as fruits, vegetables, whole grains, low-fat dairy products, and lean meat, fish, and poultry.  Eat small meals often instead of 3 large meals a day. Eat your meals slowly, and in a place where you are relaxed. Avoid bending over or lying down until 2-3 hours after eating.  Avoid eating meals 2-3 hours before bed.  Avoid drinking a lot of liquid with meals.  Cook foods using methods other than frying. Bake, grill, or broil food instead.  Avoid or limit: ? Chocolate. ? Peppermint or spearmint. ? Alcohol. ? Pepper. ? Black and decaffeinated coffee. ? Black and decaffeinated tea. ? Bubbly (carbonated) soft drinks. ? Caffeinated energy drinks and soft drinks.  Limit high-fat foods such as: ? Fatty meat or fried foods. ? Whole milk, cream, butter, or ice cream. ? Nuts and nut  butters. ? Pastries, donuts, and sweets made with butter or shortening.  Avoid foods that cause symptoms. These foods may be different for everyone. Common foods that cause symptoms include: ? Tomatoes. ? Oranges, lemons, and limes. ? Peppers. ? Spicy food. ? Onions and garlic. ? Vinegar. Lifestyle  Maintain a healthy weight. Ask your doctor what weight is healthy for you. If you need to lose weight, work with your doctor to do so safely.  Exercise for at least 30 minutes for 5 or more days each week, or as told by your doctor.  Wear loose-fitting clothes.  Do not smoke. If you need  help quitting, ask your doctor.  Sleep with the head of your bed higher than your feet. Use a wedge under the mattress or blocks under the bed frame to raise the head of the bed. Summary  When you have gastroesophageal reflux disease (GERD), food and lifestyle choices are very important in easing your symptoms.  Eat small meals often instead of 3 large meals a day. Eat your meals slowly, and in a place where you are relaxed.  Limit high-fat foods such as fatty meat or fried foods.  Avoid bending over or lying down until 2-3 hours after eating.  Avoid peppermint and spearmint, caffeine, alcohol, and chocolate. This information is not intended to replace advice given to you by your health care provider. Make sure you discuss any questions you have with your health care provider. Document Revised: 12/30/2018 Document Reviewed: 10/14/2016 Elsevier Patient Education  2020 Elsevier Inc.  Preventing High Cholesterol Cholesterol is a white, waxy substance similar to fat that the human body needs to help build cells. The liver makes all the cholesterol that a person's body needs. Having high cholesterol (hypercholesterolemia) increases a person's risk for heart disease and stroke. Extra (excess) cholesterol comes from the food the person eats. High cholesterol can often be prevented with diet and lifestyle  changes. If you already have high cholesterol, you can control it with diet and lifestyle changes and with medicine. How can high cholesterol affect me? If you have high cholesterol, deposits (plaques) may build up on the walls of your arteries. The arteries are the blood vessels that carry blood away from your heart. Plaques make the arteries narrower and stiffer. This can limit or block blood flow and cause blood clots to form. Blood clots:  Are tiny balls of cells that form in your blood.  Can move to the heart or brain, causing a heart attack or stroke. Plaques in arteries greatly increase your risk for heart attack and stroke.Making diet and lifestyle changes can reduce your risk for these conditions that may threaten your life. What can increase my risk? This condition is more likely to develop in people who:  Eat foods that are high in saturated fat or cholesterol. Saturated fat is mostly found in: ? Foods that contain animal fat, such as red meat and some dairy products. ? Certain fatty foods made from plants, such as tropical oils.  Are overweight.  Are not getting enough exercise.  Have a family history of high cholesterol. What actions can I take to prevent this? Nutrition   Eat less saturated fat.  Avoid trans fats (partially hydrogenated oils). These are often found in margarine and in some baked goods, fried foods, and snacks bought in packages.  Avoid precooked or cured meat, such as sausages or meat loaves.  Avoid foods and drinks that have added sugars.  Eat more fruits, vegetables, and whole grains.  Choose healthy sources of protein, such as fish, poultry, lean cuts of red meat, beans, peas, lentils, and nuts.  Choose healthy sources of fat, such as: ? Nuts. ? Vegetable oils, especially olive oil. ? Fish that have healthy fats (omega-3 fatty acids), such as mackerel or salmon. The items listed above may not be a complete list of recommended foods and  beverages. Contact a dietitian for more information. Lifestyle  Lose weight if you are overweight. Losing 5-10 lb (2.3-4.5 kg) can help prevent or control high cholesterol. It can also lower your risk for diabetes and high blood pressure. Ask your health  care provider to help you with a diet and exercise plan to lose weight safely.  Do not use any products that contain nicotine or tobacco, such as cigarettes, e-cigarettes, and chewing tobacco. If you need help quitting, ask your health care provider.  Limit your alcohol intake. ? Do not drink alcohol if:  Your health care provider tells you not to drink.  You are pregnant, may be pregnant, or are planning to become pregnant. ? If you drink alcohol:  Limit how much you use to:  0-1 drink a day for women.  0-2 drinks a day for men.  Be aware of how much alcohol is in your drink. In the [place] one drink equals one 12 oz bottle of beer (355 mL), one 5 oz glass of wine (148 mL), or one 1 oz glass of hard liquor (44 mL). Activity   Get enough exercise. Each week, do at least 150 minutes of exercise that takes a medium level of effort (moderate-intensity exercise). ? This is exercise that:  Makes your heart beat faster and makes you breathe harder than usual.  Allows you to still be able to talk. ? You could exercise in short sessions several times a day or longer sessions a few times a week. For example, on 5 days each week, you could walk fast or ride your bike 3 times a day for 10 minutes each time.  Do exercises as told by your health care provider. Medicines  In addition to diet and lifestyle changes, your health care provider may recommend medicines to help lower cholesterol. This may be a medicine to lower the amount of cholesterol your liver makes. You may need medicine if: ? Diet and lifestyle changes do not lower your cholesterol enough. ? You have high cholesterol and other risk factors for heart disease or stroke.  Take  over-the-counter and prescription medicines only as told by your health care provider. General information  Manage your risk factors for high cholesterol. Talk with your health care provider about all your risk factors and how to lower your risk.  Manage other conditions that you have, such as diabetes or high blood pressure (hypertension).  Have blood tests to check your cholesterol levels at regular points in time as told by your health care provider.  Keep all follow-up visits as told by your health care provider. This is important. Where to find more information  American Heart Association: www.heart.org  National Heart, Lung, and Blood Institute: PopSteam.is Summary  High cholesterol increases your risk for heart disease and stroke. By keeping your cholesterol level low, you can reduce your risk for these conditions.  High cholesterol can often be prevented with diet and lifestyle changes.  Work with your health care provider to manage your risk factors, and have your blood tested regularly. This information is not intended to replace advice given to you by your health care provider. Make sure you discuss any questions you have with your health care provider. Document Revised: 12/31/2018 Document Reviewed: 05/17/2016 Elsevier Patient Education  2020 [employer].

## 2019-10-13 DIAGNOSIS — H0014 Chalazion left upper eyelid: Secondary | ICD-10-CM | POA: Diagnosis not present
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
## 2019-11-01 ENCOUNTER — Other Ambulatory Visit: Payer: Self-pay

## 2019-11-01 ENCOUNTER — Ambulatory Visit (INDEPENDENT_AMBULATORY_CARE_PROVIDER_SITE_OTHER): Payer: BC Managed Care – PPO | Admitting: Adult Health

## 2019-11-01 ENCOUNTER — Encounter: Payer: Self-pay | Admitting: Adult Health

## 2019-11-01 VITALS — BP 128/81 | HR 64 | Temp 97.3°F | Resp 16 | Wt 219.0 lb

## 2019-11-01 DIAGNOSIS — M25511 Pain in right shoulder: Secondary | ICD-10-CM | POA: Diagnosis not present

## 2019-11-01 DIAGNOSIS — W009XXA Unspecified fall due to ice and snow, initial encounter: Secondary | ICD-10-CM | POA: Diagnosis not present

## 2019-11-01 DIAGNOSIS — E781 Pure hyperglyceridemia: Secondary | ICD-10-CM | POA: Diagnosis not present

## 2019-11-01 DIAGNOSIS — I10 Essential (primary) hypertension: Secondary | ICD-10-CM

## 2019-11-01 DIAGNOSIS — S43401A Unspecified sprain of right shoulder joint, initial encounter: Secondary | ICD-10-CM | POA: Diagnosis not present

## 2019-11-01 DIAGNOSIS — E559 Vitamin D deficiency, unspecified: Secondary | ICD-10-CM | POA: Diagnosis not present

## 2019-11-01 MED ORDER — VITAMIN D (ERGOCALCIFEROL) 1.25 MG (50000 UNIT) PO CAPS: 50000.0000 [IU] | ORAL_CAPSULE | ORAL | 0 refills | Status: AC

## 2019-11-01 NOTE — Patient Instructions (Signed)
   [place] COVID-19 info: Scientist, clinical (histocompatibility and [email or web address] Get online care: [email or web address] Address: [address] [place], [place], [place] [address] Hours:  Open ? Closes 7:30PM Phone: [phone]   Shoulder Pain Many things can cause shoulder pain, including:  An injury.  Moving the shoulder in the same way again and again (overuse).  Joint pain (arthritis). Pain can come from:  Swelling and irritation (inflammation) of any part of the shoulder.  An injury to the shoulder joint.  An injury to: ? Tissues that connect muscle to bone (tendons). ? Tissues that connect bones to each other (ligaments). ? Bones. Follow these instructions at home: Watch for changes in your symptoms. Let your doctor know about them. Follow these instructions to help with your pain. If you have a sling:  Wear the sling as told by your doctor. Remove it only as told by your doctor.  Loosen the sling if your fingers: ? Tingle. ? Become numb. ? Turn cold and blue.  Keep the sling clean.  If the sling is not waterproof: ? Do not let it get wet. ? Take the sling off when you shower or bathe. Managing pain, stiffness, and swelling   If told, put ice on the painful area: ? Put ice in a plastic bag. ? Place a towel between your skin and the bag. ? Leave the ice on for 20 minutes, 2-3 times a day. Stop putting ice on if it does not help with the pain.  Squeeze a soft ball or a foam pad as much as possible. This prevents swelling in the shoulder. It also helps to strengthen the arm. General instructions  Take over-the-counter and prescription medicines only as told by your doctor.  Keep all follow-up visits as told by your doctor. This is important. Contact a doctor if:  Your pain gets worse.  Medicine does not help your pain.  You have new pain in your arm, hand, or fingers. Get help right away if:  Your arm, hand, or fingers: ? Tingle. ? Are numb. ? Are  swollen. ? Are painful. ? Turn white or blue. Summary  Shoulder pain can be caused by many things. These include injury, moving the shoulder in the same away again and again, and joint pain.  Watch for changes in your symptoms. Let your doctor know about them.  This condition may be treated with a sling, ice, and pain medicine.  Contact your doctor if the pain gets worse or you have new pain. Get help right away if your arm, hand, or fingers tingle or get numb, swollen, or painful.  Keep all follow-up visits as told by your doctor. This is important. This information is not intended to replace advice given to you by your health care provider. Make sure you discuss any questions you have with your health care provider. Document Revised: 03/23/2018 Document Reviewed: 03/23/2018 Elsevier Patient Education  2020 [employer].

## 2019-12-02 DIAGNOSIS — Z23 Encounter for immunization: Secondary | ICD-10-CM | POA: Diagnosis not present

## 2020-01-04 ENCOUNTER — Ambulatory Visit: Payer: Self-pay | Admitting: Adult Health

## 2020-01-05 DIAGNOSIS — Z23 Encounter for immunization: Secondary | ICD-10-CM | POA: Diagnosis not present

## 2021-09-11 ENCOUNTER — Encounter (HOSPITAL_COMMUNITY): Payer: Self-pay

## 2021-09-11 ENCOUNTER — Ambulatory Visit (HOSPITAL_COMMUNITY)
Admission: EM | Admit: 2021-09-11 | Discharge: 2021-09-11 | Disposition: A | Discharge status: Home / Self Care | Payer: Self-pay | Attending: Family Medicine | Admitting: Family Medicine

## 2021-09-11 ENCOUNTER — Other Ambulatory Visit: Payer: Self-pay

## 2021-09-11 DIAGNOSIS — B349 Viral infection, unspecified: Secondary | ICD-10-CM

## 2021-09-11 DIAGNOSIS — M791 Myalgia, unspecified site: Secondary | ICD-10-CM

## 2021-09-11 DIAGNOSIS — R52 Pain, unspecified: Secondary | ICD-10-CM

## 2021-09-11 LAB — POC INFLUENZA A AND B ANTIGEN (URGENT CARE ONLY)
INFLUENZA A ANTIGEN, POC: NEGATIVE
INFLUENZA B ANTIGEN, POC: NEGATIVE

## 2021-09-11 MED ORDER — CYCLOBENZAPRINE HCL 10 MG PO TABS: ORAL_TABLET | ORAL | 0 refills | Status: DC

## 2021-09-11 MED ORDER — CYCLOBENZAPRINE HCL 10 MG PO TABS: ORAL_TABLET | ORAL | 0 refills | Status: AC

## 2021-09-11 NOTE — Discharge Instructions (Addendum)
If not allergic, you may use over the counter ibuprofen or acetaminophen as needed. ° °

## 2021-09-11 NOTE — ED Triage Notes (Signed)
Pt presents with c/o fatigue, body aches and muscle cramps X 2 days. Denies fever and cough, sore throat. States this morning he woke up with the same sxs and states he is feeling worse. States his muscles in his legs are feeling worse and achy. States he has lower back pain.

## 2021-09-11 NOTE — ED Provider Notes (Signed)
[facility]   [id number] 09/11/21 Arrival Time: 1129  ASSESSMENT & PLAN:  1. Muscle pain   2. Body aches   3. Viral illness     Discussed typical duration of suspected viral illness. Rapid influenza negative. OTC symptom care as needed.  Would like a trial of muscle relaxer. Discharge Medication List as of 09/11/2021  2:03 PM     START taking these medications   Details  cyclobenzaprine (FLEXERIL) 10 MG tablet Take 1 tablet by mouth 3 times daily as needed for muscle spasm. Warning: May cause drowsiness., Normal         Follow-up Information     Flinchum, Eula Fried, FNP.   Specialty: Family Medicine Why: If worsening or failing to improve as anticipated. Contact information: [address] [place] [address] [place] [address] [phone]                 Reviewed expectations re: course of current medical issues. Questions answered. Outlined signs and symptoms indicating need for more acute intervention. Understanding verbalized. After Visit Summary given.   SUBJECTIVE: History from: patient. Michael Stein is a 40 y.o. male who reports: fatigue, body aches, muscle spasm/aches; x 2 days. Denies: fever and cough. Abrupt onset yest evening; same today. Normal PO intake without n/v/d.  OBJECTIVE:  Vitals:   09/11/21 1249  BP: (!) 146/99  Pulse: 91  Resp: 17  Temp: 100 F (37.8 C)  TempSrc: Oral  SpO2: 99%    General appearance: alert; no distress Eyes: PERRLA; EOMI; conjunctiva normal HENT: ; AT; with mild nasal congestion Neck: supple  Lungs: speaks full sentences without difficulty; unlabored Extremities: no edema Skin: warm and dry Neurologic: normal gait Psychological: alert and cooperative; normal mood and affect  Labs: Results for orders placed or performed during the hospital encounter of 09/11/21  POC Influenza A & B Ag (Urgent Care)  Result Value Ref Range   INFLUENZA A ANTIGEN, POC NEGATIVE NEGATIVE   INFLUENZA B  ANTIGEN, POC NEGATIVE NEGATIVE   Labs Reviewed  POC INFLUENZA A AND B ANTIGEN (URGENT CARE ONLY)     No Known Allergies  Past Medical History:  Diagnosis Date   Back pain due to injury 2007   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   High cholesterol    Hypertension    Social History   Socioeconomic History   Marital status: Married    Spouse name: Not on file   Number of children: Not on file   Years of education: Not on file   Highest education level: Not on file  Occupational History   Not on file  Tobacco Use   Smoking status: Former    Types: Cigarettes    Quit date: 05/23/2008    Years since quitting: 13.3   Smokeless tobacco: Current  Substance and Sexual Activity   Alcohol use: Yes    Alcohol/week: 6.0 standard drinks    Types: 6 Cans of beer per week   Drug use: No   Sexual activity: Not on file  Other Topics Concern   Not on file  Social History Narrative   Not on file   Social Determinants of Health   Financial Resource Strain: Not on file  Food Insecurity: Not on file  Transportation Needs: Not on file  Physical Activity: Not on file  Stress: Not on file  Social Connections: Not on file  Intimate Partner Violence: Not on file   Family History  Problem Relation Age of Onset   Depression  Mother    Hyperlipidemia Father    Hypertension Father    Diabetes Maternal Grandmother    Hypertension Maternal Grandmother    Past Surgical History:  Procedure Laterality Date   CYST EXCISION  2008   ingrown hair   VASECTOMY  05/26/14.     Mardella Layman, MD 09/11/21 [phone]

## 2021-09-27 DIAGNOSIS — Z125 Encounter for screening for malignant neoplasm of prostate: Secondary | ICD-10-CM | POA: Diagnosis not present

## 2021-09-27 DIAGNOSIS — E669 Obesity, unspecified: Secondary | ICD-10-CM | POA: Diagnosis not present

## 2021-09-27 DIAGNOSIS — E559 Vitamin D deficiency, unspecified: Secondary | ICD-10-CM | POA: Diagnosis not present

## 2021-09-27 DIAGNOSIS — R0683 Snoring: Secondary | ICD-10-CM | POA: Diagnosis not present

## 2021-09-27 DIAGNOSIS — Z13220 Encounter for screening for lipoid disorders: Secondary | ICD-10-CM | POA: Diagnosis not present

## 2021-09-27 DIAGNOSIS — R7401 Elevation of levels of liver transaminase levels: Secondary | ICD-10-CM | POA: Diagnosis not present

## 2021-09-27 DIAGNOSIS — K30 Functional dyspepsia: Secondary | ICD-10-CM | POA: Diagnosis not present

## 2021-09-27 DIAGNOSIS — Z0000 Encounter for general adult medical examination without abnormal findings: Secondary | ICD-10-CM | POA: Diagnosis not present

## 2021-10-02 ENCOUNTER — Other Ambulatory Visit: Payer: Self-pay | Admitting: Family Medicine

## 2021-10-02 DIAGNOSIS — R748 Abnormal levels of other serum enzymes: Secondary | ICD-10-CM

## 2021-10-11 DIAGNOSIS — N281 Cyst of kidney, acquired: Secondary | ICD-10-CM | POA: Diagnosis not present

## 2021-10-11 DIAGNOSIS — R7401 Elevation of levels of liver transaminase levels: Secondary | ICD-10-CM | POA: Diagnosis not present

## 2021-10-11 DIAGNOSIS — K824 Cholesterolosis of gallbladder: Secondary | ICD-10-CM | POA: Diagnosis not present

## 2021-10-11 DIAGNOSIS — K760 Fatty (change of) liver, not elsewhere classified: Secondary | ICD-10-CM | POA: Diagnosis not present

## 2021-10-11 DIAGNOSIS — R945 Abnormal results of liver function studies: Secondary | ICD-10-CM | POA: Diagnosis not present

## 2021-10-25 DIAGNOSIS — K30 Functional dyspepsia: Secondary | ICD-10-CM | POA: Diagnosis not present

## 2021-10-25 DIAGNOSIS — K760 Fatty (change of) liver, not elsewhere classified: Secondary | ICD-10-CM | POA: Diagnosis not present

## 2021-10-25 DIAGNOSIS — E559 Vitamin D deficiency, unspecified: Secondary | ICD-10-CM | POA: Diagnosis not present

## 2021-10-25 DIAGNOSIS — E669 Obesity, unspecified: Secondary | ICD-10-CM | POA: Diagnosis not present

## 2021-10-25 DIAGNOSIS — K824 Cholesterolosis of gallbladder: Secondary | ICD-10-CM | POA: Diagnosis not present

## 2021-11-22 DIAGNOSIS — E782 Mixed hyperlipidemia: Secondary | ICD-10-CM | POA: Diagnosis not present

## 2021-11-22 DIAGNOSIS — K824 Cholesterolosis of gallbladder: Secondary | ICD-10-CM | POA: Diagnosis not present

## 2021-11-22 DIAGNOSIS — R1011 Right upper quadrant pain: Secondary | ICD-10-CM | POA: Diagnosis not present

## 2021-11-22 DIAGNOSIS — E668 Other obesity: Secondary | ICD-10-CM | POA: Diagnosis not present

## 2021-11-22 DIAGNOSIS — K760 Fatty (change of) liver, not elsewhere classified: Secondary | ICD-10-CM | POA: Diagnosis not present

## 2021-11-22 DIAGNOSIS — M545 Low back pain, unspecified: Secondary | ICD-10-CM | POA: Diagnosis not present

## 2021-11-22 DIAGNOSIS — E559 Vitamin D deficiency, unspecified: Secondary | ICD-10-CM | POA: Diagnosis not present

## 2021-11-22 DIAGNOSIS — R748 Abnormal levels of other serum enzymes: Secondary | ICD-10-CM | POA: Diagnosis not present

## 2021-11-22 DIAGNOSIS — K30 Functional dyspepsia: Secondary | ICD-10-CM | POA: Diagnosis not present

## 2021-11-25 ENCOUNTER — Other Ambulatory Visit: Payer: Self-pay | Admitting: Family Medicine

## 2021-11-25 DIAGNOSIS — R109 Unspecified abdominal pain: Secondary | ICD-10-CM

## 2021-12-23 ENCOUNTER — Ambulatory Visit
Admission: RE | Admit: 2021-12-23 | Discharge: 2021-12-23 | Disposition: A | Discharge status: Home / Self Care | Payer: 59 | Source: Ambulatory Visit | Attending: Family Medicine | Admitting: Family Medicine

## 2021-12-23 DIAGNOSIS — R109 Unspecified abdominal pain: Secondary | ICD-10-CM | POA: Diagnosis not present

## 2021-12-23 DIAGNOSIS — K760 Fatty (change of) liver, not elsewhere classified: Secondary | ICD-10-CM | POA: Diagnosis not present

## 2021-12-23 MED ORDER — IOPAMIDOL (ISOVUE-300) INJECTION 61%
100.0000 mL | Freq: Once | INTRAVENOUS | Status: AC | PRN
  Administered 2021-12-23: 100 mL via INTRAVENOUS

## 2024-05-11 ENCOUNTER — Telehealth (INDEPENDENT_AMBULATORY_CARE_PROVIDER_SITE_OTHER): Payer: Self-pay | Admitting: Otolaryngology

## 2024-05-11 NOTE — Telephone Encounter (Signed)
 Called and spoke with patient to let him know that we have not received his referral.  I gave him our fax # and he stated that he will contact the [facility] and have them send a referral.

## 2024-06-21 ENCOUNTER — Ambulatory Visit (INDEPENDENT_AMBULATORY_CARE_PROVIDER_SITE_OTHER): Admitting: Audiology

## 2024-06-21 ENCOUNTER — Institutional Professional Consult (permissible substitution) (INDEPENDENT_AMBULATORY_CARE_PROVIDER_SITE_OTHER): Admitting: Otolaryngology
